# Patient Record
Sex: Female | Born: 1960 | Race: White | Hispanic: No | Marital: Single | Smoking: Former smoker
Health system: Southern US, Community
[De-identification: ages and names within clinical notes are randomized; demographics above are authoritative.]

## PROBLEM LIST (undated history)

## (undated) DIAGNOSIS — I10 Essential (primary) hypertension: Secondary | ICD-10-CM

## (undated) DIAGNOSIS — I2510 Atherosclerotic heart disease of native coronary artery without angina pectoris: Secondary | ICD-10-CM

## (undated) DIAGNOSIS — N8502 Endometrial intraepithelial neoplasia [EIN]: Secondary | ICD-10-CM

## (undated) DIAGNOSIS — Z87891 Personal history of nicotine dependence: Secondary | ICD-10-CM

## (undated) DIAGNOSIS — I5030 Unspecified diastolic (congestive) heart failure: Secondary | ICD-10-CM

## (undated) DIAGNOSIS — I219 Acute myocardial infarction, unspecified: Secondary | ICD-10-CM

## (undated) DIAGNOSIS — E119 Type 2 diabetes mellitus without complications: Secondary | ICD-10-CM

## (undated) DIAGNOSIS — E669 Obesity, unspecified: Secondary | ICD-10-CM

## (undated) DIAGNOSIS — E785 Hyperlipidemia, unspecified: Secondary | ICD-10-CM

## (undated) DIAGNOSIS — J449 Chronic obstructive pulmonary disease, unspecified: Secondary | ICD-10-CM

## (undated) HISTORY — PX: LAPAROSCOPIC HYSTERECTOMY: SHX1926

## (undated) HISTORY — PX: VENTRAL HERNIA REPAIR: SHX424

## (undated) HISTORY — PX: CHOLECYSTECTOMY: SHX55

---

## 2005-03-03 ENCOUNTER — Ambulatory Visit: Payer: Self-pay | Admitting: Family Medicine

## 2005-03-05 ENCOUNTER — Ambulatory Visit: Payer: Self-pay | Admitting: Family Medicine

## 2008-09-09 DIAGNOSIS — J449 Chronic obstructive pulmonary disease, unspecified: Secondary | ICD-10-CM | POA: Insufficient documentation

## 2011-09-08 ENCOUNTER — Inpatient Hospital Stay: Payer: Self-pay | Admitting: Psychiatry

## 2011-09-08 LAB — DRUG SCREEN, URINE
Barbiturates, Ur Screen: NEGATIVE (ref ?–200)
Benzodiazepine, Ur Scrn: NEGATIVE (ref ?–200)
Cannabinoid 50 Ng, Ur ~~LOC~~: NEGATIVE (ref ?–50)
Methadone, Ur Screen: NEGATIVE (ref ?–300)
Opiate, Ur Screen: NEGATIVE (ref ?–300)
Tricyclic, Ur Screen: POSITIVE (ref ?–1000)

## 2011-09-08 LAB — CBC
HGB: 13.9 g/dL (ref 12.0–16.0)
MCH: 29.1 pg (ref 26.0–34.0)
MCV: 89 fL (ref 80–100)
Platelet: 357 10*3/uL (ref 150–440)
RBC: 4.77 10*6/uL (ref 3.80–5.20)
RDW: 19.7 % — ABNORMAL HIGH (ref 11.5–14.5)
WBC: 12.7 10*3/uL — ABNORMAL HIGH (ref 3.6–11.0)

## 2011-09-08 LAB — T4, FREE: Free Thyroxine: 3.4 ng/dL — ABNORMAL HIGH (ref 0.76–1.46)

## 2011-09-08 LAB — COMPREHENSIVE METABOLIC PANEL
Alkaline Phosphatase: 105 U/L (ref 50–136)
BUN: 8 mg/dL (ref 7–18)
Calcium, Total: 9.7 mg/dL (ref 8.5–10.1)
Co2: 23 mmol/L (ref 21–32)
EGFR (Non-African Amer.): >60
Glucose: 99 mg/dL (ref 65–99)
Osmolality: 274 (ref 275–301)
Potassium: 4 mmol/L (ref 3.5–5.1)
Sodium: 138 mmol/L (ref 136–145)
Total Protein: 8.3 g/dL — ABNORMAL HIGH (ref 6.4–8.2)

## 2011-09-08 LAB — ETHANOL
Ethanol %: <0.003 % (ref 0.000–0.080)
Ethanol: <3 mg/dL

## 2011-09-08 LAB — SALICYLATE LEVEL: Salicylates, Serum: 2.7 mg/dL

## 2011-09-12 LAB — CBC WITH DIFFERENTIAL/PLATELET
Basophil #: 0.1 10*3/uL (ref 0.0–0.1)
Basophil %: 0.5 %
Eosinophil %: 1.5 %
HCT: 41.6 % (ref 35.0–47.0)
HGB: 13.2 g/dL (ref 12.0–16.0)
Lymphocyte #: 3.4 10*3/uL (ref 1.0–3.6)
Lymphocyte %: 30.7 %
MCHC: 31.8 g/dL — ABNORMAL LOW (ref 32.0–36.0)
MCV: 89 fL (ref 80–100)
Monocyte #: 1 10*3/uL — ABNORMAL HIGH (ref 0.0–0.7)
Platelet: 305 10*3/uL (ref 150–440)
RBC: 4.67 10*6/uL (ref 3.80–5.20)

## 2012-02-08 LAB — COMPREHENSIVE METABOLIC PANEL
BUN: 15 mg/dL (ref 7–18)
Bilirubin,Total: 0.3 mg/dL (ref 0.2–1.0)
Chloride: 104 mmol/L (ref 98–107)
Co2: 26 mmol/L (ref 21–32)
Creatinine: 1.3 mg/dL (ref 0.60–1.30)
EGFR (African American): 55 — ABNORMAL LOW
EGFR (Non-African Amer.): 47 — ABNORMAL LOW
Osmolality: 276 (ref 275–301)
Potassium: 3.8 mmol/L (ref 3.5–5.1)
SGPT (ALT): 23 U/L (ref 12–78)
Sodium: 137 mmol/L (ref 136–145)
Total Protein: 8.5 g/dL — ABNORMAL HIGH (ref 6.4–8.2)

## 2012-02-08 LAB — ETHANOL: Ethanol: <3 mg/dL

## 2012-02-08 LAB — DRUG SCREEN, URINE
Amphetamines, Ur Screen: NEGATIVE (ref ?–1000)
Barbiturates, Ur Screen: NEGATIVE (ref ?–200)
Benzodiazepine, Ur Scrn: NEGATIVE (ref ?–200)
Cannabinoid 50 Ng, Ur ~~LOC~~: NEGATIVE (ref ?–50)
Methadone, Ur Screen: NEGATIVE (ref ?–300)
Opiate, Ur Screen: NEGATIVE (ref ?–300)
Phencyclidine (PCP) Ur S: NEGATIVE (ref ?–25)

## 2012-02-08 LAB — CBC
HCT: 48.7 % — ABNORMAL HIGH (ref 35.0–47.0)
HGB: 15.5 g/dL (ref 12.0–16.0)
MCHC: 31.8 g/dL — ABNORMAL LOW (ref 32.0–36.0)
Platelet: 365 10*3/uL (ref 150–440)
RBC: 5.62 10*6/uL — ABNORMAL HIGH (ref 3.80–5.20)
RDW: 18.1 % — ABNORMAL HIGH (ref 11.5–14.5)
WBC: 21 10*3/uL — ABNORMAL HIGH (ref 3.6–11.0)

## 2012-02-09 ENCOUNTER — Inpatient Hospital Stay: Payer: Self-pay | Admitting: Psychiatry

## 2012-02-09 LAB — URINALYSIS, COMPLETE
Bacteria: NONE SEEN
Glucose,UR: NEGATIVE mg/dL (ref 0–75)
Ketone: NEGATIVE
Nitrite: NEGATIVE
Specific Gravity: 1.025 (ref 1.003–1.030)
Squamous Epithelial: 16
WBC UR: 3 /HPF (ref 0–5)

## 2012-02-09 LAB — HEMOGLOBIN A1C: Hemoglobin A1C: 6 % (ref 4.2–6.3)

## 2012-05-03 ENCOUNTER — Emergency Department: Payer: Self-pay | Admitting: Emergency Medicine

## 2012-07-12 ENCOUNTER — Emergency Department: Payer: Self-pay | Admitting: Emergency Medicine

## 2012-07-12 LAB — COMPREHENSIVE METABOLIC PANEL
Albumin: 3.1 g/dL — ABNORMAL LOW (ref 3.4–5.0)
BUN: 11 mg/dL (ref 7–18)
Calcium, Total: 9.6 mg/dL (ref 8.5–10.1)
Chloride: 110 mmol/L — ABNORMAL HIGH (ref 98–107)
Co2: 20 mmol/L — ABNORMAL LOW (ref 21–32)
Creatinine: 0.71 mg/dL (ref 0.60–1.30)
Osmolality: 274 (ref 275–301)
Potassium: 4.2 mmol/L (ref 3.5–5.1)
SGOT(AST): 19 U/L (ref 15–37)
Sodium: 138 mmol/L (ref 136–145)
Total Protein: 7.9 g/dL (ref 6.4–8.2)

## 2012-07-12 LAB — URINALYSIS, COMPLETE
Bilirubin,UR: NEGATIVE
Hyaline Cast: 10
Nitrite: NEGATIVE
Ph: 5 (ref 4.5–8.0)
Protein: >=500
RBC,UR: 13 /HPF (ref 0–5)
Squamous Epithelial: 12

## 2012-07-12 LAB — CBC
HCT: 48.6 % — ABNORMAL HIGH (ref 35.0–47.0)
HGB: 16 g/dL (ref 12.0–16.0)
MCH: 29.9 pg (ref 26.0–34.0)
MCHC: 32.8 g/dL (ref 32.0–36.0)
MCV: 91 fL (ref 80–100)
Platelet: 330 10*3/uL (ref 150–440)
RBC: 5.33 10*6/uL — ABNORMAL HIGH (ref 3.80–5.20)
RDW: 16.9 % — ABNORMAL HIGH (ref 11.5–14.5)

## 2012-07-12 LAB — TROPONIN I: Troponin-I: <0.02 ng/mL

## 2012-07-12 LAB — PRO B NATRIURETIC PEPTIDE: B-Type Natriuretic Peptide: 164 pg/mL — ABNORMAL HIGH (ref 0–125)

## 2012-07-12 LAB — CK TOTAL AND CKMB (NOT AT ARMC): CK, Total: 36 U/L (ref 21–215)

## 2012-07-18 LAB — CULTURE, BLOOD (SINGLE)

## 2012-10-20 ENCOUNTER — Inpatient Hospital Stay: Payer: Self-pay | Admitting: Internal Medicine

## 2012-10-20 LAB — PRO B NATRIURETIC PEPTIDE: B-Type Natriuretic Peptide: 81 pg/mL (ref 0–125)

## 2012-10-20 LAB — BASIC METABOLIC PANEL WITH GFR
Anion Gap: 5 — ABNORMAL LOW
BUN: 14 mg/dL
Calcium, Total: 10.8 mg/dL — ABNORMAL HIGH
Chloride: 105 mmol/L
Co2: 30 mmol/L
Creatinine: 0.77 mg/dL
EGFR (African American): >60
EGFR (Non-African Amer.): >60
Glucose: 100 mg/dL — ABNORMAL HIGH
Osmolality: 280
Potassium: 4.1 mmol/L
Sodium: 140 mmol/L

## 2012-10-20 LAB — HEPATIC FUNCTION PANEL A (ARMC)
Albumin: 3 g/dL — ABNORMAL LOW (ref 3.4–5.0)
Bilirubin,Total: 0.2 mg/dL (ref 0.2–1.0)
SGOT(AST): 23 U/L (ref 15–37)
SGPT (ALT): 20 U/L (ref 12–78)
Total Protein: 7.7 g/dL (ref 6.4–8.2)

## 2012-10-20 LAB — CK TOTAL AND CKMB (NOT AT ARMC)
CK, Total: 68 U/L
CK-MB: <0.5 ng/mL — ABNORMAL LOW

## 2012-10-20 LAB — CBC
HGB: 14.7 g/dL (ref 12.0–16.0)
MCH: 29.7 pg (ref 26.0–34.0)
Platelet: 331 10*3/uL (ref 150–440)
WBC: 15 10*3/uL — ABNORMAL HIGH (ref 3.6–11.0)

## 2012-10-20 LAB — TROPONIN I
Troponin-I: <0.02 ng/mL
Troponin-I: <0.02 ng/mL

## 2012-10-21 LAB — CBC WITH DIFFERENTIAL/PLATELET
Basophil #: 0.1 10*3/uL (ref 0.0–0.1)
Basophil %: 0.5 %
Eosinophil %: 0 %
MCH: 30.1 pg (ref 26.0–34.0)
MCHC: 33.6 g/dL (ref 32.0–36.0)
MCV: 90 fL (ref 80–100)
Monocyte #: 0.2 x10 3/mm (ref 0.2–0.9)
Neutrophil %: 87.3 %
Platelet: 334 10*3/uL (ref 150–440)
RDW: 16.1 % — ABNORMAL HIGH (ref 11.5–14.5)
WBC: 14.9 10*3/uL — ABNORMAL HIGH (ref 3.6–11.0)

## 2012-10-21 LAB — BASIC METABOLIC PANEL
BUN: 15 mg/dL (ref 7–18)
Calcium, Total: 10.4 mg/dL — ABNORMAL HIGH (ref 8.5–10.1)
Chloride: 108 mmol/L — ABNORMAL HIGH (ref 98–107)
Co2: 27 mmol/L (ref 21–32)
Creatinine: 0.78 mg/dL (ref 0.60–1.30)
Glucose: 203 mg/dL — ABNORMAL HIGH (ref 65–99)
Osmolality: 286 (ref 275–301)
Potassium: 4.8 mmol/L (ref 3.5–5.1)
Sodium: 140 mmol/L (ref 136–145)

## 2012-10-21 LAB — LIPID PANEL
Cholesterol: 210 mg/dL — ABNORMAL HIGH (ref 0–200)
Ldl Cholesterol, Calc: 123 mg/dL — ABNORMAL HIGH (ref 0–100)
Triglycerides: 302 mg/dL — ABNORMAL HIGH (ref 0–200)
VLDL Cholesterol, Calc: 60 mg/dL — ABNORMAL HIGH (ref 5–40)

## 2012-10-21 LAB — TROPONIN I: Troponin-I: <0.02 ng/mL

## 2012-10-21 LAB — MAGNESIUM: Magnesium: 2 mg/dL

## 2012-10-26 LAB — CULTURE, BLOOD (SINGLE)

## 2012-11-29 ENCOUNTER — Ambulatory Visit: Payer: Self-pay

## 2013-10-23 IMAGING — CT CT HEAD WITHOUT CONTRAST
1 series · 16 of 30 positions shown, 20 images · non-contrast
Comparison: none

REASON FOR EXAM: headache, diffuse paresthesias, no motor deficits
COMMENTS:

[Series 2: soft tissue · axial · 0.42mm/px · z∈[-183,-43]mm · 16 of 32 slices shown, 20 images]
[im 2/32  brain]
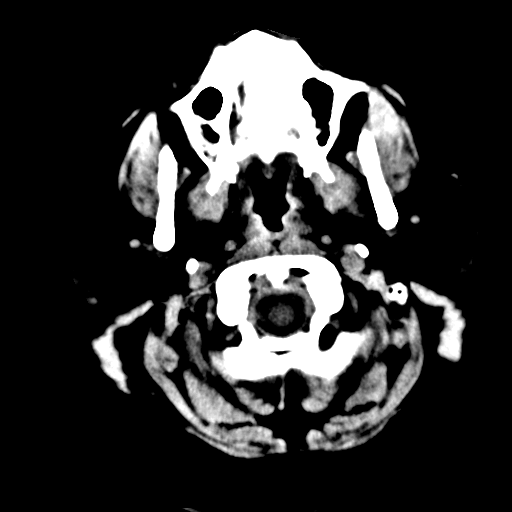
[im 2/32  bone]
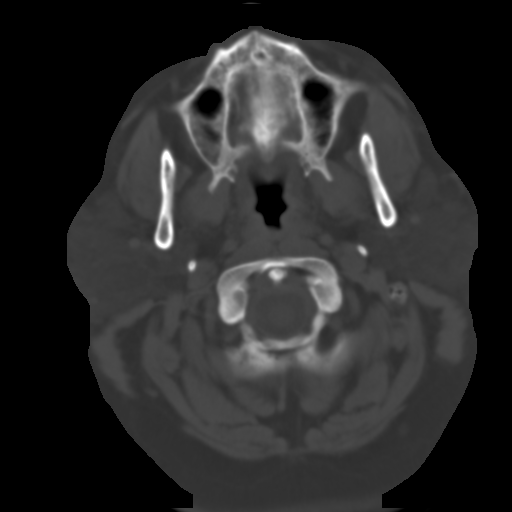
[im 4/32  brain]
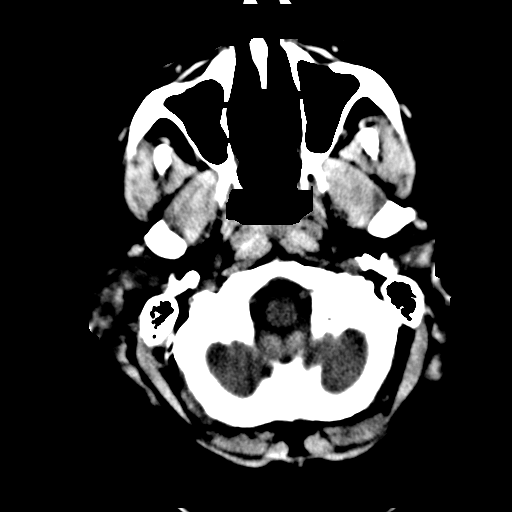
[im 6/32  brain]
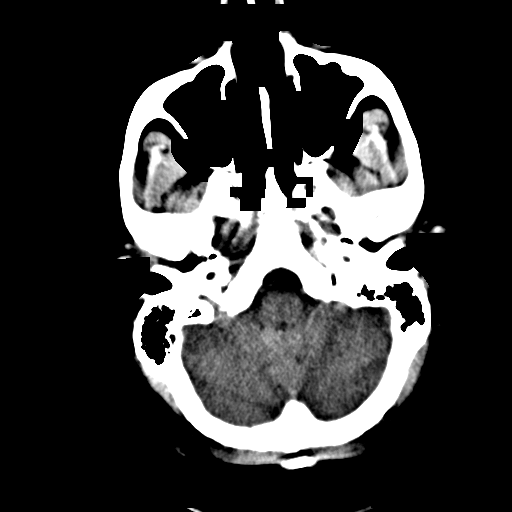
[im 8/32  brain]
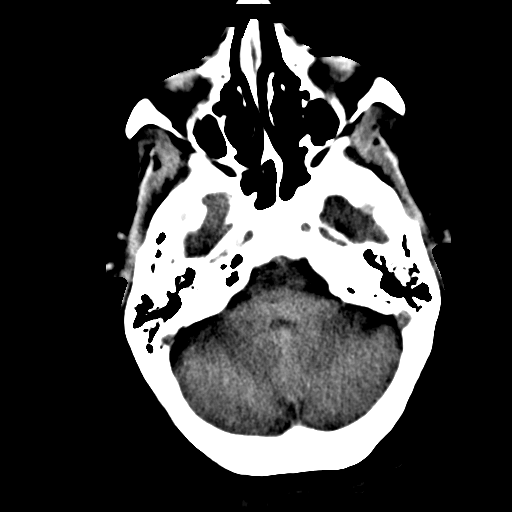
[im 9/32  brain]
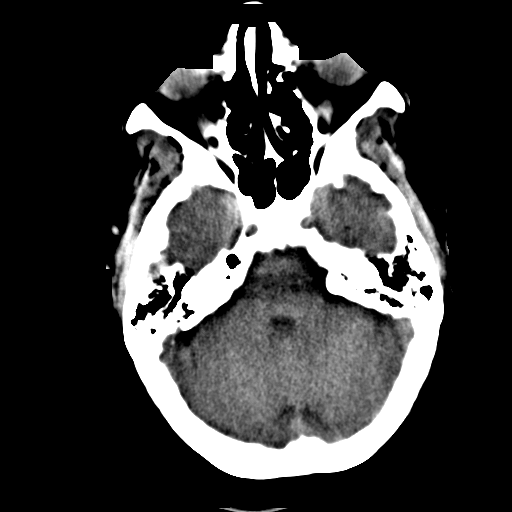
[im 9/32  bone]
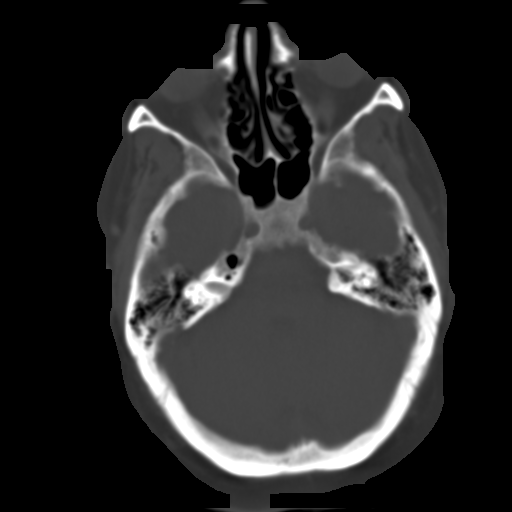
[im 11/32  brain]
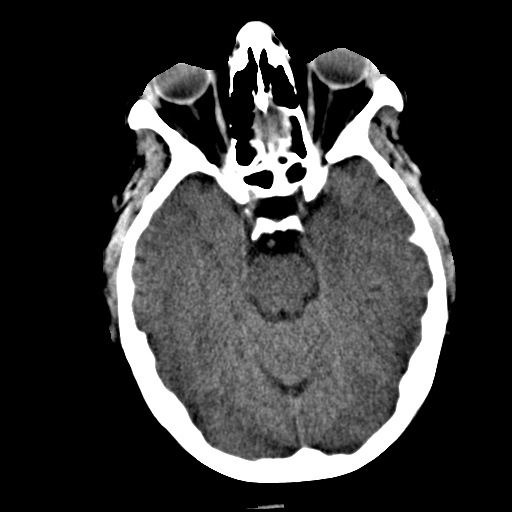
[im 13/32  brain]
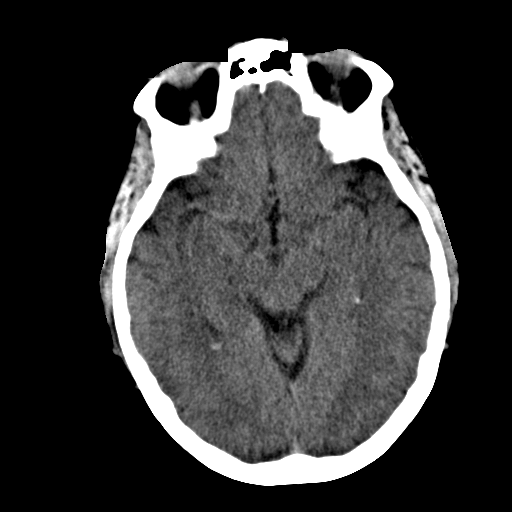
[im 15/32  brain]
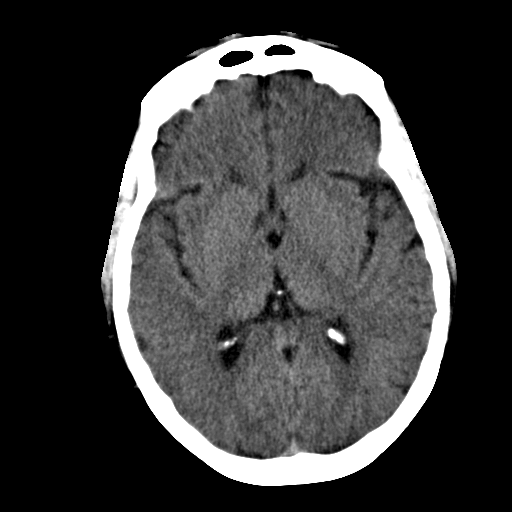
[im 17/32  brain]
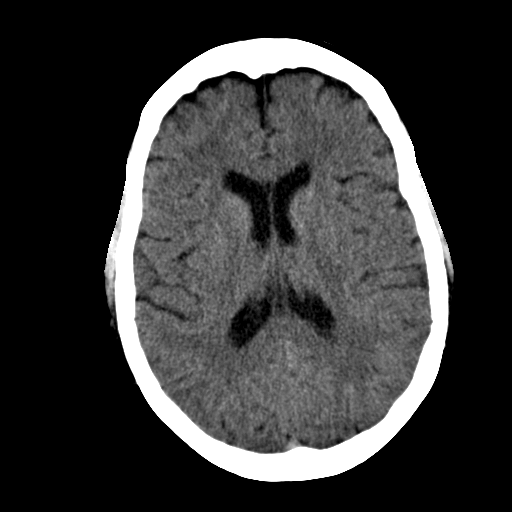
[im 17/32  bone]
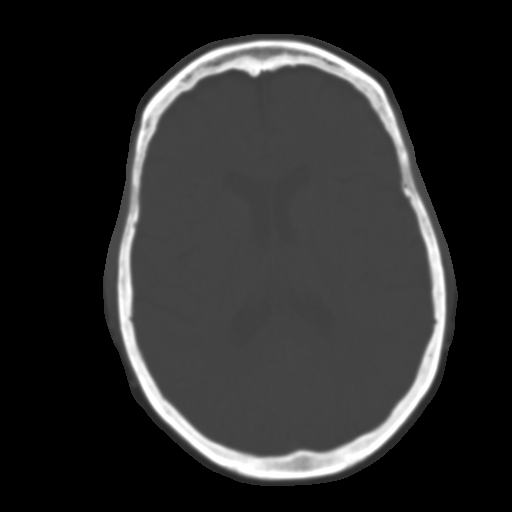
[im 19/32  brain]
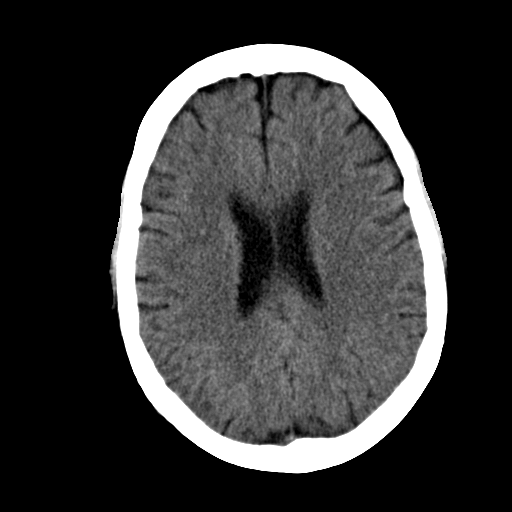
[im 21/32  brain]
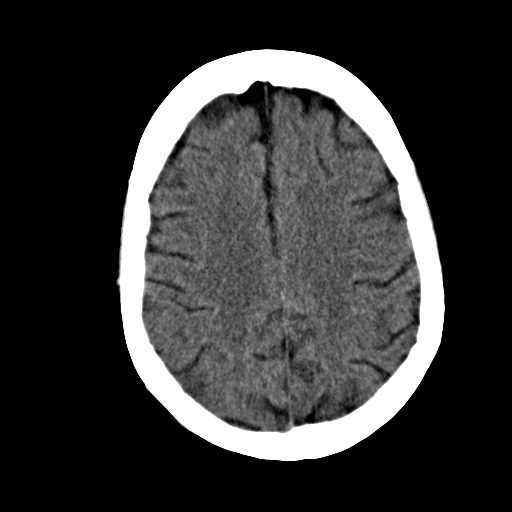
[im 23/32  brain]
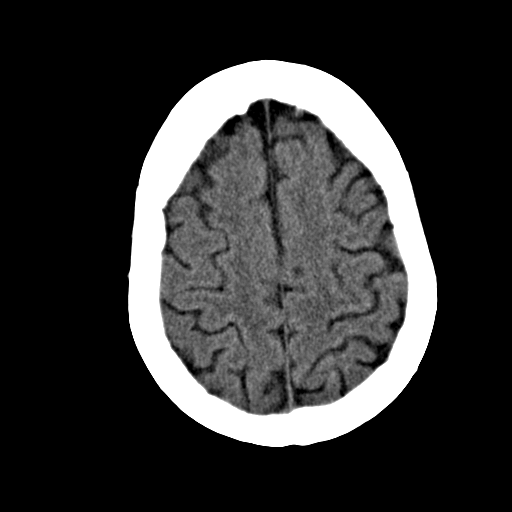
[im 24/32  brain]
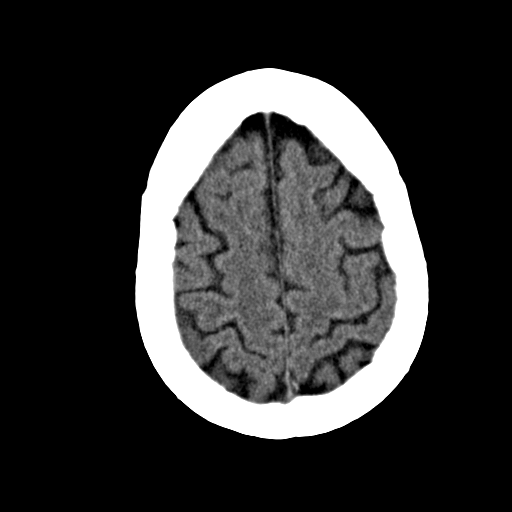
[im 24/32  bone]
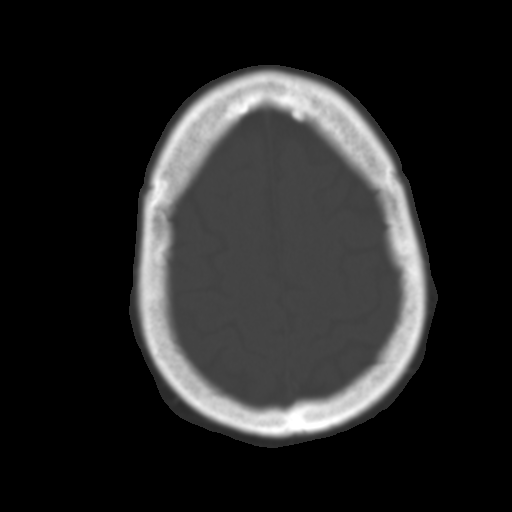
[im 26/32  brain]
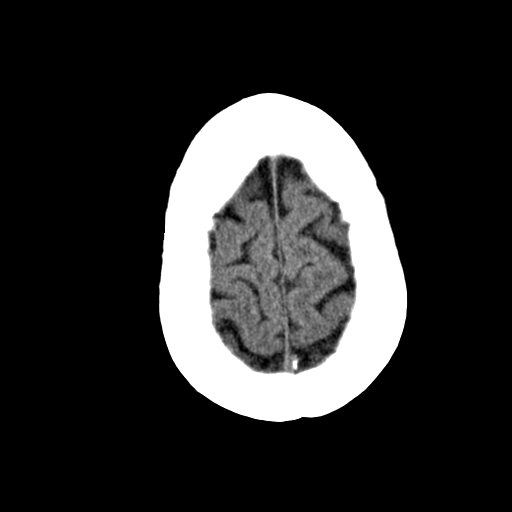
[im 28/32  brain]
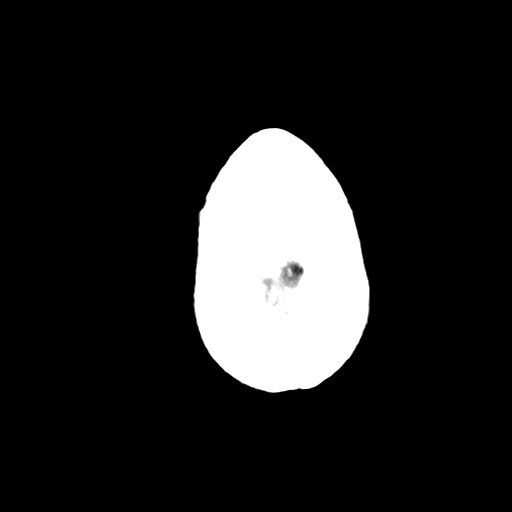
[im 30/32  brain]
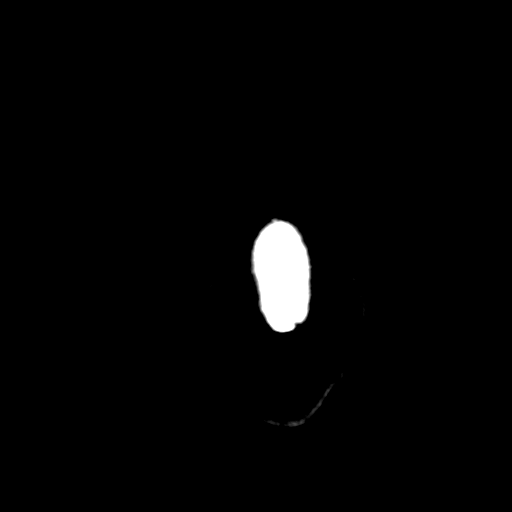

[16 of 30 positions shown; findings below may reference images not displayed]

PROCEDURE:     CT  - CT HEAD WITHOUT CONTRAST  - July 12, 2012  [DATE]

RESULT:     Axial noncontrast CT scanning was performed through the brain
with reconstructions at 3 mm intervals and slice thicknesses.

The ventricles are normal in size and position. There is no intracranial
hemorrhage nor intracranial mass effect. The cerebellum and brainstem are
normal in density. There is no evidence of an evolving ischemic event. There
are no abnormal intracranial calcifications. At bone window settings the
observed portions of the paranasal sinuses and mastoid air cells are clear.
There is no evidence of an acute skull fracture.
IMPRESSION: Normal CT scan of the brain for age.

[REDACTED]

## 2014-02-01 IMAGING — CR DG CHEST 2V
1 series · 2 of 2 positions shown · non-contrast
Comparison: none

REASON FOR EXAM: SOB.
COMMENTS:

[Series 1: w chest pa · 0.14mm/px · 2 of 2 slices shown]
[im 1/2]
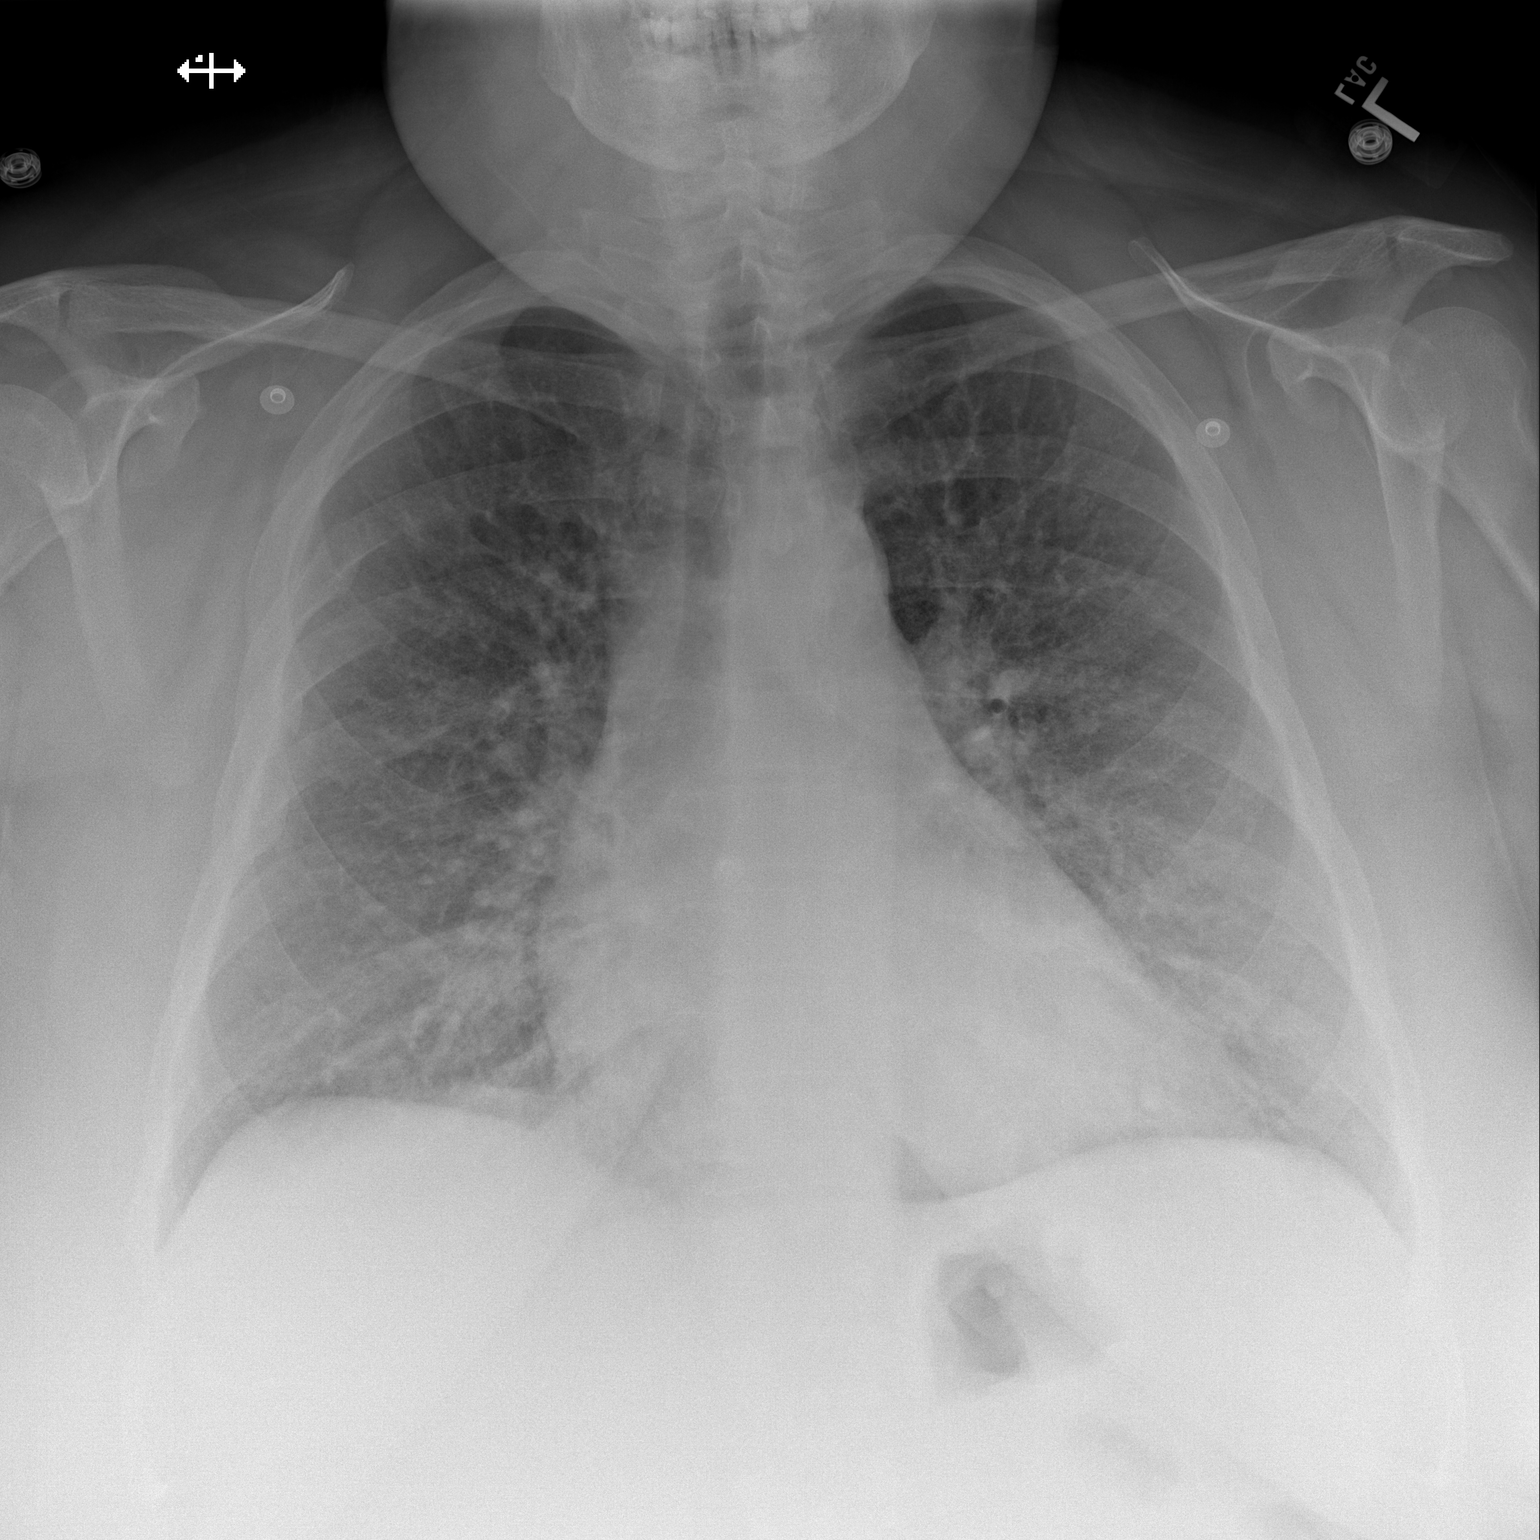
[im 2/2]
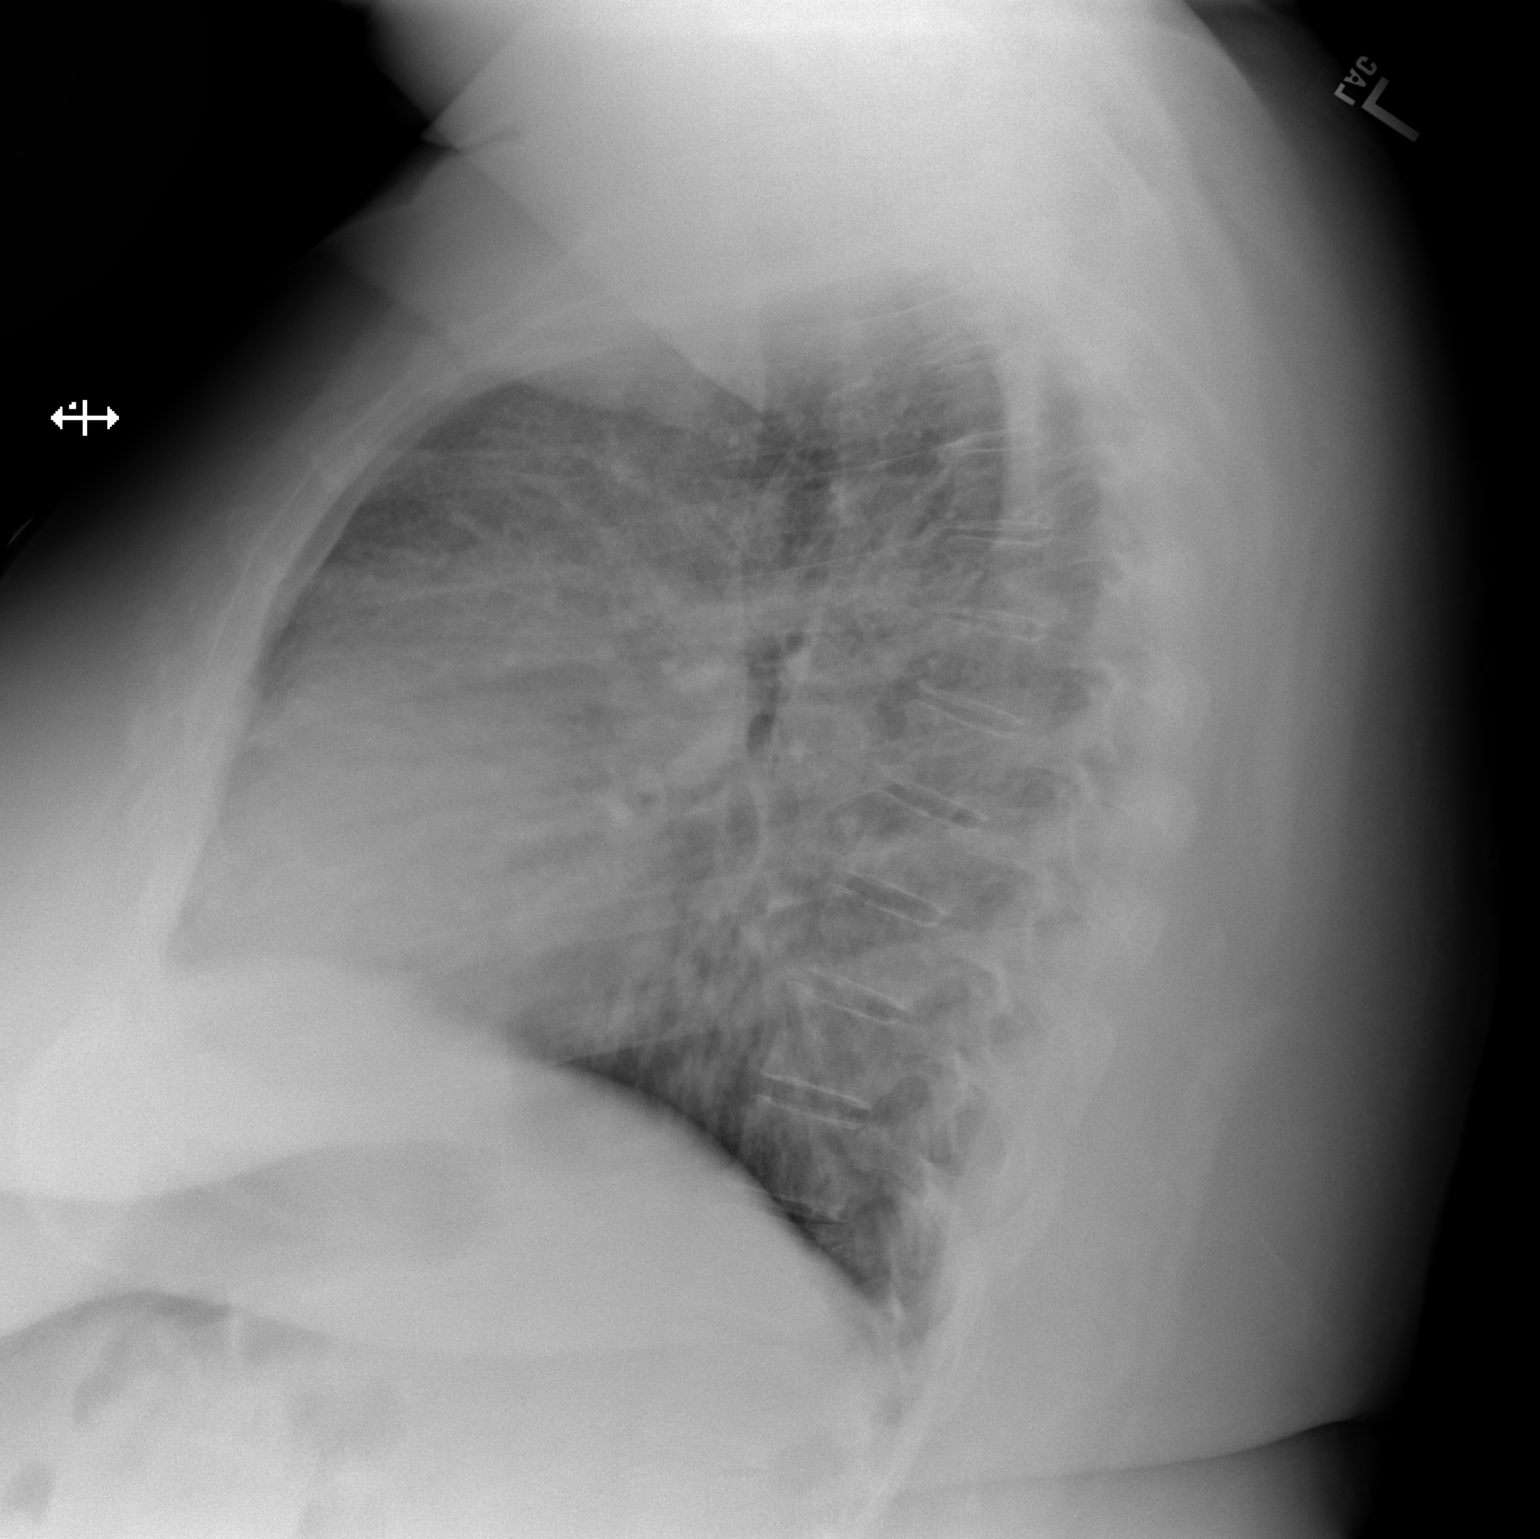

[2 of 2 positions shown; findings below may reference images not displayed]

PROCEDURE:     DXR - DXR CHEST PA (OR AP) AND LATERAL  - October 21, 2012  [DATE]

RESULT:     Comparison is made to the previous examination dated 20 October, 2012.
There is diffuse interstitial thickening. Peribronchial cuffing is present.
There is no effusion or pneumothorax. The heart is borderline enlarged.
There is no significant change.
IMPRESSION: Changes suggestive of diffuse interstitial edematous or
nonedematous infiltrate. Correlate for pulmonary edema.

[REDACTED]

## 2014-10-05 NOTE — Discharge Summary (Signed)
PATIENT NAME:  [name], [name] MR#:  [id number] DATE OF BIRTH:  28-Nov-1960  DATE OF ADMISSION:  10/20/2012 DATE OF DISCHARGE:  10/24/2012  DISCHARGE DIAGNOSES:  1. Acute on chronic respiratory failure, likely due to chronic obstructive pulmonary disease exacerbation, improving on steroids and nebulizer breathing treatment.  2. Chronic obstructive pulmonary disease exacerbation, improving.  3. Depression, not suicidal, not dangerous, improving on BuSpar 3 times [name] day and extra dose of  Neurontin at night. Also started on Elavil at nighttime for pain control and sleep.   SECONDARY DIAGNOSES:  4. Chronic obstructive pulmonary disease.  5. Morbid obesity. 6. Diabetes.  7. History of myocardial infarction.  8. Multiple suicidal attempts in the past.   CONSULTATIONS: Psychiatry, Dr. Toni Amend.   PROCEDURES AND RADIOLOGY:  1. Chest x-ray on 8th of May showed cardiomegaly with pulmonary edema.  2. Chest x-ray on 9th of May showed changes suggestive of diffuse interstitial edematous/nonedematous infiltrate, correlate for pulmonary edema.  3. [name] 2-D echocardiogram on 9th of May showed normal LV systolic function with EF of 55% to 60%, normal global LV systolic function. Mild mitral and tricuspid regurgitation.   MAJOR LABORATORY PANEL: Blood cultures x2 were negative on 8th of May.   HISTORY AND SHORT HOSPITAL COURSE: The patient is [name] 54 year old morbidly obese female with the above-mentioned medical problems, who was admitted for acute on chronic respiratory failure, likely secondary to COPD exacerbation. There was some concern for CHF, for which 2-D echo was obtained, but this was ruled out. This was thought to be just plain COPD exacerbation. She responded fairly well to steroids and nebulizer breathing treatment and was close to her baseline on 12th of May and was discharged home in stable condition. She was evaluated by psychiatry, Dr. Toni Amend, while in the hospital considering her significant  depression and was started on several medications which seemed to help her, and she was much better.   PERTINENT PHYSICAL EXAMINATION ON THE DATE OF DISCHARGE:  VITAL SIGNS: As follows: Temperature 97.7, heart rate 98 per minute, respirations 19 per minute, blood pressure 157/99 mmHg. She was saturating 96% on room air.  CARDIOVASCULAR: S1, S2 normal. No murmurs, rubs or gallops.  LUNGS: Clear to auscultation bilaterally. No wheezing, rales, rhonchi or crepitation.  ABDOMEN: Soft, benign.  NEUROLOGIC: Nonfocal examination.  All other physical examination remained at baseline.   DISCHARGE MEDICATIONS:  1. Spiriva once daily.  2. Hydrochlorothiazide/lisinopril 12.5/10 one tablet p.o. daily.  3. Metformin 500 mg p.o. daily.  4. Requip 1 mg p.o. 3 times [name] day.  5. Potassium chloride 8 mEq p.o. daily.  6. Lasix 20 mg p.o. daily.  7. Albuterol nebulizer every 4 hours as needed. 8. Combivent 1 puff inhaled every 6 hours.  9. Prednisone 60 mg p.o. daily, taper by 10 mg daily until finished.  10. Gabapentin 300 mg p.o. 3 times [name] day. 11. Gabapentin 300 mg 2 capsules p.o. q.h.[name].  12. Amitriptyline 10 mg p.o. q.h.[name].  13. Trazodone 50 mg p.o. q.h.[name].  14. Buspirone 10 mg p.o. 3 times [name] day. 15. Habitrol 21 mg transdermal patch once daily.   DISCHARGE DIET: Low sodium, low fat, low cholesterol.   DISCHARGE ACTIVITY: As tolerated.   DISCHARGE INSTRUCTIONS AND FOLLOWUP: The patient was instructed to follow with her primary care physician, Dr. Elizabeth Sauer, in 1 to 2 weeks. She will need followup with Dr. Meredeth Ide from pulmonary in 2 to 4 weeks.   TOTAL TIME DISCHARGING THIS PATIENT: 55 minutes.   ____________________________  Kinzy Weyers [name]. Sherryll BurgerShah, MD vss:OSi D: 10/28/2012 18:38:29 ET T: 10/29/2012 06:59:17 ET JOB#: [id number]  cc: [name] [name]. Sherryll BurgerShah, MD, <Dictator> Wendy Limerickeanna C. Jones, MD Wendy AmelJohn T. Clapacs, MD Wendy E. Meredeth IdeFleming, MD [name] [facility] MD ELECTRONICALLY SIGNED 10/31/2012 8:55

## 2014-10-05 NOTE — Consult Note (Signed)
Psychiatry: Followup for this patient with a history of anxiety and depression. Today she reports that she slept better last night. Anxiety is a little bit decreased. She is not feeling as agitated. Denies any suicidal ideation. Affect appears calmer. Tolerating medicines well.and educational therapy done. No change to medication plan.  Electronic Signatures: Clapacs, Jackquline DenmarkJohn T (MD)  (Signed on 10-May-14 23:08)  Authored  Last Updated: 10-May-14 23:08 by Audery Amellapacs, John T (MD)

## 2014-10-05 NOTE — Consult Note (Signed)
PATIENT NAME:  Wendy [name], Wendy [name] MR#:  [id number] DATE OF BIRTH:  06-Mar-1961  DATE OF CONSULTATION:  10/21/2012  REFERRING PHYSICIAN:   CONSULTING PHYSICIAN:  Wendy AmelJohn T. Austine Wiedeman, MD  IDENTIFYING INFORMATION AND REASON FOR CONSULTATION:  [name] 54 year old woman admitted to the hospital for chronic obstructive pulmonary disease exacerbation along with multiple other medical problems.  Consultation because of history of depression and anxiety.   HISTORY OF PRESENT ILLNESS:  Information obtained from the patient and from the chart.  The patient was admitted to the hospital on referral from an outpatient doctor because of worsening shortness of breath which appears to probably be related mostly to her COPD.  The patient reports to me that she has been feeling irritable at times.  She says that she gets particularly angry when she is denied the food that she wants to eat.  She finds the hospital environment to be very upsetting because she is constantly being told that she cannot eat sugar, cannot eat salt, cannot have [name] cigarette et Karie Sodacetera.  Even though she knows intellectually the reasons for those things it makes her very angry to be told that she cannot do things.  Apparently, she was somewhat ugly about it this morning for which she currently apologizes.  The patient says that in general her mood has been anxious.  She has chronic poor sleep at night sleeping only [name] couple hours at [name] time.  Recently she has been having more mood swings back and forth with irritability, anger, depression, tearfulness.  This has been since stopping her Effexor in the last couple of months.  She discontinued her Effexor however because she thought it was causing her to have restless legs feeling at night.  Since stopping the Effexor the restless legs has gotten better, but she is having more psychiatric symptoms.  She is still taking her BuSpar which she thinks is at 5 mg 3 times [name] day and has been taking trazodone.  She stopped taking  Ambien because it really was not helping with her sleep regularly.  She has been following up with [name] therapist with [facility], but has not seen Dr. Marguerite [name] in [name] while.   PAST PSYCHIATRIC HISTORY:  The patient had [name] hospitalization on the psychiatric ward in mid to late 2013 because of mood and anxiety problems.  She responded to treatment on the psychiatry ward including medication and was [facility] 02[facility]16/2012 [facility]  She has been going there ever since.  This is her only psychiatric hospitalization.  She denies ever having actually tried to kill herself.  Denies any history of physical violence.  Does not report history of psychotic symptoms.   SOCIAL HISTORY:  The patient has severe social challenges.  She is homeless and stays just where she can find someone who will take her in.  She had been staying with her son previously, but her son has thrown her out of the house because his current boyfriend does not want her around.  Consequently she had to stay with some friends in [place], but that is only [name] temporary arrangement as well.  She has worked with [employer] on putting in an application 2 [employer], but that is still pending.  She does have several members of her family who seem to still have strong emotional feelings for her and she has strong feelings for her children, her parents and her grandchildren.  She has multiple reasons she can cite for wanting to live.  PAST MEDICAL HISTORY:  Morbid obesity accompanied by multiple medical problems including diabetes, elevated cholesterol, COPD from her heavy smoking.  She continues to smoke heavily despite her breathing problems.  She is aware of that she has multiple medical problems, but has been unable to control herself to stay compliant and get them under good control.  She has recently started to have worsening burning pain in her feet which she says has been diagnosed as fibromyalgia, but it sounds like diabetic neuropathy  from how she describes it.   REVIEW OF SYSTEMS:  Complains currently of anxious mood.  Feeling confined and claustrophobic in the hospital.  Being frustrated.  Poor sleep.  Increased appetite.  Denies suicidal or homicidal ideation.  Denies psychotic symptoms.  She is short of breath, having trouble breathing.  Has pain in her legs and numbness in her hands.  MENTAL STATUS EXAMINATION:  The patient is mildly disheveled despite obvious attempts to clean herself up and looks older than her stated age.  She is cooperative and pleasant and chatty in the interview.  Makes good eye contact.  Psychomotor activity normal given her physical condition.  Affect mildly anxious, reactive, but appropriate, smiles quite [name] bit.  Makes appropriate jokes.  Mood stated as being better.  Thoughts are lucid.  No evidence of loosening of associations or delusions.  Denies auditory or visual hallucinations.  Denies suicidal or homicidal ideation.  Shows good judgment and insight overall, although she admits that she gets impulsive at times and sometimes feels like she cannot control herself.  Baseline intelligence normal.   CURRENT MEDICATIONS:  Multiple medicines, but psychiatrically it sounds like what she has been taking outside the hospital was BuSpar 5 mg 3 times [name] day, Neurontin 300 mg 3 times [name] day, trazodone 50 to 100 mg at night probably and has been off of the Ambien.    ALLERGIES:  LEVOFLOXACIN.   ASSESSMENT:  [name] 54 year old woman with chronic depression and anxiety.  Has had multiple diagnoses, but I would probably say right now that dysthymia and generalized anxiety would fit the picture.  May have [name] personality disorder, although right now she seems to be pretty pleasant and accommodating and have reasonably good insight.  The patient's anxiety problems are certainly going to work against having the patience to cooperate with medical treatment and she is aware of that.  Psychiatrically she probably could do with  some adjustment to her medicine.  She says that all the serotonin reuptake inhibitor she has taken which include Zoloft and Paxil as well as the Effexor have caused restless leg symptoms.   TREATMENT PLAN:  Given her history of side effects we discussed possible options.  I would like to increase her BuSpar to 10 mg 3 times [name] day.  I would also like to try to adding [name] small 10 mg of amitriptyline at night which may help with her sleep and pain.  If she tolerates it okay it could be very gradually titrated up to the point where it may help with her mood as well.  I would also add an extra 600 mg of Neurontin at night to make [name] total of 900 mg given at night.  This may help with her sleep as well as with the pain that is waking her up at night, [name] little bit better.  I will follow up.  She has follow-up treatment through [facility] already arranged.  If she is here through the weekend I recommend that the [facility]  community support liaison be notified.  They can probably be of some help in continuing her social work as she transitions outside the hospital.   DIAGNOSIS, PRINCIPAL AND PRIMARY:  AXIS I:  Dysthymia.   SECONDARY DIAGNOSES: AXIS I:  Generalized anxiety disorder.  AXIS II:  Deferred.  AXIS III:  Obesity, diabetes, chronic obstructive pulmonary disease, hypertension, elevated cholesterol, diabetic neuropathy.  AXIS IV:  Severe from homelessness.  AXIS V:  Functioning at time of evaluation 39.    ____________________________ Wendy Amel, MD jtc:ea D: 10/21/2012 19:45:38 ET T: 10/22/2012 03:59:29 ET JOB#: [id number]  cc: Wendy Amel, MD, <Dictator> Wendy Amel MD ELECTRONICALLY SIGNED 10/23/2012 21:50

## 2014-10-05 NOTE — H&P (Signed)
PATIENT NAME:  Wendy [name], Wendy [name] MR#:  [id number] DATE OF BIRTH:  11-12-1960  DATE OF ADMISSION:  10/20/2012  PRIMARY CARE PHYSICIAN: Dr. Robbie Lis at [facility].   PRESENTING COMPLAINT: Worsening shortness of breath.  REFERRING PHYSICIAN: Dr. Enedina Finner.   HISTORY OF PRESENT ILLNESS: This is [name] 54 year old female who has past medical history of hypercholesterolemia, hypocalcemia, morbid obesity, hypertension, chronic obstructive pulmonary disease, diabetes and multiple suicidal attempts in psychiatric history.   Complains that for the last few months she has been feeling increasingly short of breath especially while lying down flat in the bed at night and she has to sleep in [name] recliner. Her legs are also more swollen. This complaint is gradually getting worse, and so when she saw her primary care physician 2 to 3 weeks ago, she suggested her to go to [facility]. She went to [facility] ER, but because she did not get enough attention or medications, she just walked out from there without any medication or any interventions. Her condition continued to worsen and so today when she went to see her primary care, she suggested her to come to the ER over here.  Initial workup in ER showed normal BNP.  She was given steroids IV but not much response with nebulizer treatment and so she is being admitted for chronic obstructive pulmonary disease exacerbation.   REVIEW OF SYSTEMS:   CONSTITUTIONAL: Negative for fever. Positive for fatigue and generalized weakness.   EYES: No blurring, double vision, pain or redness.   EARS, NOSE, THROAT: No tinnitus, ear pain or hearing loss.   RESPIRATORY: No cough, wheezing, but has shortness of breath.   CARDIOVASCULAR: No chest pain or arrhythmia but had severe orthopnea and edema on the legs.   GASTROINTESTINAL: No nausea, vomiting, diarrhea, abdominal pain or hematemesis.   GENITOURINARY: No dysuria, hematuria or increased frequency of the urine.   SKIN: No  acne or rashes on the skin.   MUSCULOSKELETAL: No joint pain or swelling.   NEUROLOGICAL: No numbness, weakness, vertigo or headache.   PSYCHIATRIC: No anxiety, insomnia or bipolar disorder.   PAST MEDICAL HISTORY: Chronic obstructive pulmonary disease, continued smoking, morbid obesity, diabetes, non-ST elevation myocardial infarction in 2010, cardiac catheter was done no stent,  multiple suicidal attempts followed by psychiatrist, hypercholesterolemia.   PAST SURGICAL HISTORY:  Carpal tunnel release and repair of scalping incident at work.   FAMILY HISTORY: Mother has [name] history of skin cancer, colon cancer in both parents, Hodgkin's disease in maternal uncle, leukemia in maternal uncle.   SOCIAL HISTORY: Living with her parents and her daughters and then her friends frequently to and fro from one place to another.  Smokes 1-1/2 packs per day. Denies drinking alcohol or doing any illegal drug use.   MEDICATIONS AT HOME:  Albuterol inhaler every four hours as needed. Buspirone 7.5 mg oral tablet 3 times [name] day, Combivent 1 puff inhalation every 6 hours, furosemide 20 mg oral tablet once [name] day, Gabapentin 300 mg oral capsule 3 times [name] day, hydrochlorothiazide plus lisinopril 12.5 mg plus 10 mg oral tablet for high blood pressure and renal protection, metformin 500 mg oral tablet once [name] day, potassium chloride 8 mEq tablet extended-release once [name] day, Requip 1 mg oral tablet 3 times [name] day, Spiriva 18 mcg inhalation capsule once [name] day, trazodone 50 mg oral tablet 1 to 2 tablets once [name] day and bedtime and venlafaxine extended-release 150 mg oral capsule 2 times [name] day.   PHYSICAL EXAMINATION:  VITAL SIGNS: In ER, temperature 98.2, pulse rate 107, respiratory rate 22, blood pressure 140/75, pulse oximetry 95 on room air.   GENERAL: She is fully alert and oriented in mild distress, morbidly obese.   HEAD AND NECK:  Normocephalic.  Conjunctivae are pink.  Neck supple. No JVD.   RESPIRATORY:  Bilateral equal air entry. Few rhonchi heard.  No local tenderness on pressure.   CARDIOVASCULAR: S1, S2 present, regular. No murmur.   ABDOMEN: Obese bowel sounds present. No organomegaly.   SKIN: Changes of chronic fluid retention and edema on lower limbs.  Pitting edema on both lower limbs present.    JOINTS: No swelling or tenderness.   NEUROLOGICAL: Power 5/5.  Moves all four limbs.   LABORATORY RESULTS:  Glucose 100. BNP 81.  BUN 14, creatinine 0.77, sodium 140, potassium 4.1, chloride 105, calcium 10.8, total protein 7.7, bilirubin 0.2, alkaline phosphatase 102, SGOT 23, SGOT 20.  WBC 15,000, hemoglobin 14.7, platelet count 331, MCV 89.   EKG: Sinus tachy with [name] rate 107.   ASSESSMENT AND PLAN: [name] 54 year old female morbidly obese, hypertension, hypercholesterolemia and non-ST-elevation myocardial infarction in the past, history of depression, chronic obstructive pulmonary disease and continued smoker, has worsening shortness of breath with pedal edema and orthopnea for the last few months. BNP is negative. May be false due to obesity.  Few rhonchi on chest exam.   PLAN: 1. Chronic obstructive pulmonary disease exacerbation. Will give IV steroid, DuoNeb, Spiriva, and cefuroxime and repeat chest x-ray tomorrow morning to see for any new finding.  2. History of coronary artery disease in 2010.  We will continue home medication.  We will add aspirin and statin and check [name] lipid profile tomorrow morning.  3. Complaint of orthopnea and edema plus worsening shortness of breath.  Maybe all these complaints are due to her sleep apnea, but just to make sure we will also get echocardiogram about her cardiac status.  4. Hypertension. We will continue home medication.  5. Diabetes: Insulin sliding scale and we will hold metformin for now.  6. Depression: Continue home medication.  7. Current smoker.  Counseled for 5 minutes for smoking cessation. She agrees for that and wants to try nicotine patch  this time.    Total Time Spent: 50 minutes for this admission:    ____________________________ Hope PigeonVaibhavkumar G. Elisabeth PigeonVachhani, MD vgv:rw D: 10/20/2012 18:32:00 ET T: 10/20/2012 19:00:23 ET JOB#: [id number]  cc: Hope PigeonVaibhavkumar G. Elisabeth PigeonVachhani, MD, <Dictator> Altamese DillingVAIBHAVKUMAR Dymphna Wadley MD ELECTRONICALLY SIGNED 10/27/2012 6:22

## 2014-10-05 NOTE — Consult Note (Signed)
Brief Consult Note: Diagnosis: dysthymia, GAD,.   Patient was seen by consultant.   Consult note dictated.   Orders entered.   Comments: Psychiatry: PAtient seen. Patient with chronic mental health problems of depression, anxiety and irritability and also multiple medical issues. Patient denies SI and is not dangerous. She is chroniccally irritable and not great at impulse control. We discussed various med options. She does not want effexor as it causes her legs to feel restless at night. Will restart her buspar at 10mg  tid and add 600mg  extra neurontin at night for pain and sleep and also 10mg  elevil at night for pain and sleep. Patient agrees.  Electronic Signatures: Audery Amellapacs, John T (MD)  (Signed 09-May-14 19:34)  Authored: Brief Consult Note   Last Updated: 09-May-14 19:34 by Audery Amellapacs, John T (MD)

## 2014-10-07 NOTE — Discharge Summary (Signed)
PATIENT NAME:  [name], [name] MR#:  [id number] DATE OF BIRTH:  12-26-1960  DATE OF ADMISSION:  09/08/2011 DATE OF DISCHARGE:  09/16/2011  HISTORY OF PRESENT ILLNESS: [name] is [name] 54 year old female admitted to the inpatient behavioral health unit with depression and anxiety. She was also having thoughts of hopelessness and helplessness. She had stopped all of her psychotropic medications as well as her general medicines for two weeks in hopes that she would die. She was also described by her boyfriend looking for [name] gun to kill herself.   Please see the admission history and physical dictation.   ANCILLARY CLINICAL DATA: She continued CPAP as well as her general medications. Please see the admission history and physical.   HOSPITAL COURSE: [name] was admitted to the inpatient behavioral health unit and underwent milieu and group psychotherapy. She was started on venlafaxine, restarting what she had been on before, however, the venlafaxine was titrated to 150 mg XR q.[name].m. The augmentation with buspirone 5 mg t.i.d. was continued. She was continued on clonazepam 0.5 mg b.i.d. p.r.n. anxiety, Vistaril 25 to 50 mg at bedtime p.r.n. insomnia.   Over the course of the hospitalization her excessive crying stopped. She became progressively involved in the milieu and group psychotherapy.   Her energy improved. Her hope returned. Her suicidal ideation resolved. She developed constructive future goals. She also learned how to confront irrational cognitions and make rational statements to herself regarding conditions in her life. She showed an improvement in self esteem.   CONDITION ON DISCHARGE: [name] has intact normal energy. Her concentration is normal. Her interests are normal. She has constructive future goals. She had no thoughts of harming herself or others. She has no hallucinations or delusions. She is sleeping well. She is requiring 50 mg of Vistaril at bedtime as [name] sleep aid at this time with  the goal of discontinuing it as the venlafaxine, buspirone and psychotherapy take further effect.   MENTAL STATUS EXAM UPON DISCHARGE: [name] is alert. Her eye contact is good. Her concentration is normal. She is oriented completely to all spheres. Her memory is intact to immediate, recent, and remote. Her fund of knowledge, intelligence, and use of language are normal. Her speech involves normal rate and prosody without dysarthria. Thought process is logical, coherent, and goal directed. No looseness of associations. Thought content: No thoughts of harming herself, no thoughts of harming others. No delusions or hallucinations. Insight is intact. Judgment is intact. Affect is broad and appropriate. Mood is within normal limits.   DISCHARGE DIAGNOSES:  AXIS I:  1. Major depressive disorder, recurrent, in clinical remission.  2. Generalized anxiety disorder, well controlled.   AXIS II: Deferred.   AXIS III:  1. Obstructive sleep apnea. 2. Diabetes mellitus.  3. Right lower extremity neuropathy.   AXIS IV: Primary support group, economic.   AXIS V: 55.   [name] is not at risk for harming herself or others. She agrees to call emergency services immediately for any thoughts of harming herself, thoughts of harming others, or distress. She agrees to not drive if drowsy.   DIET: Regular.   ACTIVITY: Routine.   DISCHARGE MEDICATIONS:  1. Albuterol ipratropium oral inhaler 2 puffs q.6h. p.r.n.  2. Advair Diskus 25/50 inhaler inhalation b.i.d.  3. Tiotropium hand haler 1 capsule inhalation daily. 4. Lasix 20 mg daily.  5. Potassium chloride 8 mEq daily. 6. Gabapentin 300 mg t.i.d.  7. Metformin 500 mg b.i.d.  8. HCTZ 12.5 mg daily.  9. Minocycline 100 mg oral q.12 hours.  10. Requip 0.5 mg t.i.d.  11. Hydroxyzine pamoate 25 to 50 mg at bedtime p.r.n. insomnia. 12. Buspirone 5 mg t.i.d.  13. Venlafaxine XR 150 mg daily. 14. Klonopin 0.5 mg b.i.d. p.r.n. anxiety with the goal of  discontinuing the Klonopin as her venlafaxine, buspirone and psychotherapy gain further affect.   DIET: ADA 2000 calorie.   ACTIVITY: Routine.   FOLLOW UP: [facility]  04/11 12 noon. She will also see Fatima SangerHeather Carroll for follow up of her medical problems including her right lower extremity neuropathy symptoms 04/08 11:00 [name].m.    ____________________________ Adelene AmasJames S. Rexanne Inocencio, MD jsw:cms D: 09/18/2011 23:05:57 ET T: 09/21/2011 12:28:16 ET JOB#: [id number]  cc: Adelene AmasJames S. Sianna Garofano, MD, <Dictator> Lester CarolinaJAMES S Elisabetta Mishra MD ELECTRONICALLY SIGNED 09/24/2011 0:05
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
## 2016-06-17 ENCOUNTER — Other Ambulatory Visit: Payer: Self-pay | Admitting: Obstetrics and Gynecology

## 2016-06-17 DIAGNOSIS — Z1231 Encounter for screening mammogram for malignant neoplasm of breast: Secondary | ICD-10-CM

## 2016-10-17 ENCOUNTER — Other Ambulatory Visit: Payer: Self-pay | Admitting: Obstetrics and Gynecology

## 2016-11-21 ENCOUNTER — Other Ambulatory Visit: Payer: Self-pay | Admitting: Obstetrics and Gynecology

## 2016-12-21 ENCOUNTER — Other Ambulatory Visit: Payer: Self-pay | Admitting: Obstetrics and Gynecology

## 2017-01-21 ENCOUNTER — Other Ambulatory Visit: Payer: Self-pay | Admitting: Obstetrics and Gynecology

## 2017-02-01 ENCOUNTER — Other Ambulatory Visit: Payer: Self-pay | Admitting: Obstetrics and Gynecology

## 2020-07-07 ENCOUNTER — Observation Stay
Admission: EM | Admit: 2020-07-07 | Discharge: 2020-07-08 | Disposition: A | Discharge status: Home / Self Care | Payer: Medicare Other | Attending: Internal Medicine | Admitting: Internal Medicine

## 2020-07-07 ENCOUNTER — Other Ambulatory Visit: Payer: Self-pay

## 2020-07-07 ENCOUNTER — Emergency Department: Payer: Medicare Other

## 2020-07-07 DIAGNOSIS — I10 Essential (primary) hypertension: Secondary | ICD-10-CM | POA: Diagnosis not present

## 2020-07-07 DIAGNOSIS — I252 Old myocardial infarction: Secondary | ICD-10-CM | POA: Diagnosis not present

## 2020-07-07 DIAGNOSIS — E66813 Obesity, class 3: Secondary | ICD-10-CM | POA: Diagnosis present

## 2020-07-07 DIAGNOSIS — N179 Acute kidney failure, unspecified: Secondary | ICD-10-CM | POA: Diagnosis present

## 2020-07-07 DIAGNOSIS — R0602 Shortness of breath: Secondary | ICD-10-CM | POA: Insufficient documentation

## 2020-07-07 DIAGNOSIS — I4891 Unspecified atrial fibrillation: Principal | ICD-10-CM | POA: Insufficient documentation

## 2020-07-07 DIAGNOSIS — Z7984 Long term (current) use of oral hypoglycemic drugs: Secondary | ICD-10-CM | POA: Insufficient documentation

## 2020-07-07 DIAGNOSIS — Z20822 Contact with and (suspected) exposure to covid-19: Secondary | ICD-10-CM | POA: Diagnosis not present

## 2020-07-07 DIAGNOSIS — Z7901 Long term (current) use of anticoagulants: Secondary | ICD-10-CM | POA: Diagnosis not present

## 2020-07-07 DIAGNOSIS — Z79899 Other long term (current) drug therapy: Secondary | ICD-10-CM | POA: Insufficient documentation

## 2020-07-07 DIAGNOSIS — J449 Chronic obstructive pulmonary disease, unspecified: Secondary | ICD-10-CM | POA: Diagnosis present

## 2020-07-07 DIAGNOSIS — E119 Type 2 diabetes mellitus without complications: Secondary | ICD-10-CM

## 2020-07-07 DIAGNOSIS — R079 Chest pain, unspecified: Secondary | ICD-10-CM | POA: Diagnosis present

## 2020-07-07 DIAGNOSIS — E876 Hypokalemia: Secondary | ICD-10-CM | POA: Diagnosis present

## 2020-07-07 HISTORY — DX: Unspecified diastolic (congestive) heart failure: I50.30

## 2020-07-07 HISTORY — DX: Atherosclerotic heart disease of native coronary artery without angina pectoris: I25.10

## 2020-07-07 HISTORY — DX: Obesity, unspecified: E66.9

## 2020-07-07 HISTORY — DX: Hyperlipidemia, unspecified: E78.5

## 2020-07-07 HISTORY — DX: Personal history of nicotine dependence: Z87.891

## 2020-07-07 HISTORY — DX: Endometrial intraepithelial neoplasia (EIN): N85.02

## 2020-07-07 HISTORY — DX: Type 2 diabetes mellitus without complications: E11.9

## 2020-07-07 HISTORY — DX: Acute myocardial infarction, unspecified: I21.9

## 2020-07-07 HISTORY — DX: Essential (primary) hypertension: I10

## 2020-07-07 HISTORY — DX: Chronic obstructive pulmonary disease, unspecified: J44.9

## 2020-07-07 LAB — CBC
HCT: 43 % (ref 36.0–46.0)
Hemoglobin: 13.4 g/dL (ref 12.0–15.0)
MCH: 27.9 pg (ref 26.0–34.0)
MCHC: 31.2 g/dL (ref 30.0–36.0)
MCV: 89.6 fL (ref 80.0–100.0)
Platelets: 314 10*3/uL (ref 150–400)
RBC: 4.8 MIL/uL (ref 3.87–5.11)
RDW: 15.7 % — ABNORMAL HIGH (ref 11.5–15.5)
WBC: 11 10*3/uL — ABNORMAL HIGH (ref 4.0–10.5)
nRBC: 0 % (ref 0.0–0.2)

## 2020-07-07 LAB — BASIC METABOLIC PANEL
Anion gap: 11 (ref 5–15)
BUN: 14 mg/dL (ref 6–20)
CO2: 25 mmol/L (ref 22–32)
Calcium: 10 mg/dL (ref 8.9–10.3)
Chloride: 104 mmol/L (ref 98–111)
Creatinine, Ser: 1.12 mg/dL — ABNORMAL HIGH (ref 0.44–1.00)
GFR, Estimated: 56 mL/min — ABNORMAL LOW (ref >60)
Glucose, Bld: 109 mg/dL — ABNORMAL HIGH (ref 70–99)
Potassium: 3.1 mmol/L — ABNORMAL LOW (ref 3.5–5.1)
Sodium: 140 mmol/L (ref 135–145)

## 2020-07-07 LAB — TROPONIN I (HIGH SENSITIVITY)
Troponin I (High Sensitivity): 12 ng/L (ref <18)
Troponin I (High Sensitivity): 11 ng/L (ref <18)

## 2020-07-07 LAB — BRAIN NATRIURETIC PEPTIDE: B Natriuretic Peptide: 135.6 pg/mL — ABNORMAL HIGH (ref 0.0–100.0)

## 2020-07-07 LAB — CBG MONITORING, ED: Glucose-Capillary: 130 mg/dL — ABNORMAL HIGH (ref 70–99)

## 2020-07-07 LAB — MAGNESIUM: Magnesium: 1.8 mg/dL (ref 1.7–2.4)

## 2020-07-07 MED ORDER — INSULIN ASPART 100 UNIT/ML ~~LOC~~ SOLN
0.0000 [IU] | Freq: Three times a day (TID) | SUBCUTANEOUS | Status: DC
  Administered 2020-07-08: 3 [IU] via SUBCUTANEOUS
  Filled 2020-07-07: qty 1

## 2020-07-07 MED ORDER — SODIUM CHLORIDE 0.9 % IV BOLUS
500.0000 mL | Freq: Once | INTRAVENOUS | Status: AC
  Administered 2020-07-07: 500 mL via INTRAVENOUS

## 2020-07-07 MED ORDER — DILTIAZEM HCL 25 MG/5ML IV SOLN
25.0000 mg | Freq: Once | INTRAVENOUS | Status: AC
  Administered 2020-07-07: 25 mg via INTRAVENOUS
  Filled 2020-07-07: qty 5

## 2020-07-07 MED ORDER — SODIUM CHLORIDE 0.9 % IV BOLUS: 1000.0000 mL | Freq: Once | INTRAVENOUS | Status: DC

## 2020-07-07 MED ORDER — POTASSIUM CHLORIDE CRYS ER 20 MEQ PO TBCR
40.0000 meq | EXTENDED_RELEASE_TABLET | Freq: Once | ORAL | Status: AC
  Administered 2020-07-07: 40 meq via ORAL
  Filled 2020-07-07: qty 2

## 2020-07-07 MED ORDER — ACETAMINOPHEN 325 MG PO TABS: 650.0000 mg | ORAL_TABLET | ORAL | Status: DC | PRN

## 2020-07-07 MED ORDER — ONDANSETRON HCL 4 MG/2ML IJ SOLN: 4.0000 mg | Freq: Four times a day (QID) | INTRAMUSCULAR | Status: DC | PRN

## 2020-07-07 MED ORDER — FUROSEMIDE 10 MG/ML IJ SOLN
40.0000 mg | Freq: Once | INTRAMUSCULAR | Status: AC
  Administered 2020-07-08: 40 mg via INTRAVENOUS
  Filled 2020-07-07: qty 4

## 2020-07-07 MED ORDER — INSULIN ASPART 100 UNIT/ML ~~LOC~~ SOLN: 0.0000 [IU] | Freq: Every day | SUBCUTANEOUS | Status: DC

## 2020-07-07 MED ORDER — POTASSIUM CHLORIDE 10 MEQ/100ML IV SOLN
10.0000 meq | INTRAVENOUS | Status: AC
Start: 2020-07-07 — End: 2020-07-07
  Administered 2020-07-07: 10 meq via INTRAVENOUS
  Filled 2020-07-07: qty 100

## 2020-07-07 MED ORDER — SODIUM CHLORIDE 0.9 % IV SOLN: INTRAVENOUS | Status: AC

## 2020-07-07 MED ORDER — ENOXAPARIN SODIUM 40 MG/0.4ML ~~LOC~~ SOLN
40.0000 mg | SUBCUTANEOUS | Status: DC
  Administered 2020-07-07: 40 mg via SUBCUTANEOUS
  Filled 2020-07-07: qty 0.4

## 2020-07-07 MED ORDER — DILTIAZEM HCL 60 MG PO TABS
30.0000 mg | ORAL_TABLET | Freq: Four times a day (QID) | ORAL | Status: DC
  Administered 2020-07-07: 30 mg via ORAL
  Filled 2020-07-07: qty 1

## 2020-07-07 NOTE — ED Provider Notes (Signed)
[facility] Emergency Department Provider Note  ____________________________________________  Time seen: Approximately 5:48 PM  I have reviewed the triage vital signs and the nursing notes.   HISTORY  Chief Complaint Chest Pain and Shortness of Breath    HPI [name] is a 60 y.o. female who presents the emergency department complaining of chest tightness, palpitations sensation, shortness of breath with activity.  Patient states that if she is sitting still she does not feel "bad" but states she stands up the chest tightness occurs, she feels like her heart is racing/palpitating and she becomes short of breath.  Patient has a history of MI in 2010.  She states that these symptoms feel nothing like the previous MI.  Patient has no other cardiac history.  She is never been diagnosed with an arrhythmia.  She denies any fevers or chills, URI symptoms, abdominal symptoms.  Patient does not take any medications for this complaint prior to arrival.         History reviewed. No pertinent past medical history.  Patient Active Problem List   Diagnosis Date Noted  . HTN (hypertension) 07/07/2020  . Type 2 diabetes mellitus without complication (HCC) 07/07/2020  . Atrial fibrillation with rapid ventricular response - New onset(HCC) 07/07/2020  . New onset atrial fibrillation (HCC) 07/07/2020  . History of MI (myocardial infarction) 07/07/2020  . Obesity, Class III, BMI 40-49.9 (morbid obesity) (HCC) 07/07/2020  . Chest pain 07/07/2020  . Chronic obstructive pulmonary disease, unspecified (HCC) 09/09/2008    History reviewed. No pertinent surgical history.  Prior to Admission medications   Not on File    Allergies Patient has no known allergies.  History reviewed. No pertinent family history.  Social History     Review of Systems  Constitutional: No fever/chills Eyes: No visual changes. No discharge ENT: No upper respiratory  complaints. Cardiovascular: no chest pain.  Patient describes palpitations, chest pressure Respiratory: no cough. No SOB. Gastrointestinal: No abdominal pain.  No nausea, no vomiting.  No diarrhea.  No constipation. Genitourinary: Negative for dysuria. No hematuria Musculoskeletal: Negative for musculoskeletal pain. Skin: Negative for rash, abrasions, lacerations, ecchymosis. Neurological: Negative for headaches, focal weakness or numbness.  10 System ROS otherwise negative.  ____________________________________________   PHYSICAL EXAM:  VITAL SIGNS: ED Triage Vitals  Enc Vitals Group     BP 07/07/20 1624 (!) 163/82     Pulse Rate 07/07/20 1624 (!) 129     Resp 07/07/20 1624 (!) 26     Temp 07/07/20 1624 98.8 F (37.1 C)     Temp Source 07/07/20 1624 Oral     SpO2 07/07/20 1624 99 %     Weight 07/07/20 1625 (!) 375 lb (170.1 kg)     Height 07/07/20 1625 5\' 3"  (1.6 m)     Head Circumference --      Peak Flow --      Pain Score 07/07/20 1625 0     Pain Loc --      Pain Edu? --      Excl. in GC? --      Constitutional: Alert and oriented. Well appearing and in no acute distress. Eyes: Conjunctivae are normal. PERRL. EOMI. Head: Atraumatic. ENT:      Ears:       Nose: No congestion/rhinnorhea.      Mouth/Throat: Mucous membranes are moist.  Neck: No stridor.    Cardiovascular: Irregular regular rhythm with tachycardia between 110 and 135 palpation. Normal S1 and S2.  Good  peripheral circulation. Respiratory: Normal respiratory effort without tachypnea or retractions. Lungs CTAB. Good air entry to the bases with no decreased or absent breath sounds. Gastrointestinal: Bowel sounds 4 quadrants. Soft and nontender to palpation. No guarding or rigidity. No palpable masses. No distention. No CVA tenderness. Musculoskeletal: Full range of motion to all extremities. No gross deformities appreciated. Neurologic:  Normal speech and language. No gross focal neurologic deficits are  appreciated.  Skin:  Skin is warm, dry and intact. No rash noted. Psychiatric: Mood and affect are normal. Speech and behavior are normal. Patient exhibits appropriate insight and judgement.   ____________________________________________   LABS (all labs ordered are listed, but only abnormal results are displayed)  Labs Reviewed  BASIC METABOLIC PANEL - Abnormal; Notable for the following components:      Result Value   Potassium 3.1 (*)    Glucose, Bld 109 (*)    Creatinine, Ser 1.12 (*)    GFR, Estimated 56 (*)    All other components within normal limits  CBC - Abnormal; Notable for the following components:   WBC 11.0 (*)    RDW 15.7 (*)    All other components within normal limits  SARS CORONAVIRUS 2 (TAT 6-24 HRS)  TROPONIN I (HIGH SENSITIVITY)  TROPONIN I (HIGH SENSITIVITY)   ____________________________________________  EKG  ED ECG REPORT I, Delorise Royals Annaleah Arata,  personally viewed and interpreted this ECG.   Date: 07/07/2020  EKG Time: 1619 hrs.  Rate: 136 bpm  Rhythm: atrial fibrillation, rate 130s, Compared to previous EKG from May 2014, new onset A. fib  Axis: Normal axis  Intervals:none  ST&T Change: No gross ST elevation or depression noted. nonspecific T wave changes  No STEMI.  A. fib with RVR with rates in the 130s to 150s ____________________________________________  RADIOLOGY I personally viewed and evaluated these images as part of my medical decision making, as well as reviewing the written report by the radiologist.  ED Provider Interpretation:   DG Chest 2 View  Result Date: 07/07/2020 CLINICAL DATA:  60 year old female with shortness of breath. EXAM: CHEST - 2 VIEW COMPARISON:  Chest radiograph dated 10/21/2012. FINDINGS: Mild cardiomegaly with vascular congestion and edema. Trace pleural effusions may be present. No pneumothorax. No acute osseous pathology. IMPRESSION: Cardiomegaly with findings of CHF. Pneumonia is not excluded clinical  correlation is recommended. Electronically Signed   By: Elgie Collard M.D.   On: 07/07/2020 17:51    ____________________________________________    PROCEDURES  Procedure(s) performed:    Procedures    Medications  diltiazem (CARDIZEM) injection 25 mg (25 mg Intravenous Given 07/07/20 1843)     ____________________________________________   INITIAL IMPRESSION / ASSESSMENT AND PLAN / ED COURSE  Pertinent labs & imaging results that were available during my care of the patient were reviewed by me and considered in my medical decision making (see chart for details).  Review of the [place] CSRS was performed in accordance of the [facility] prior to dispensing any controlled drugs.           Patient's diagnosis is consistent with A. fib with RVR.  Patient presented to the emergency department with a palpitation/heart racing sensation with movement.  Patient states that symptoms began 3 days ago.  She states that there is some pressure in her chest when she stands up but there is no frank pain.  She had a MI in 2010 and states that this feels nothing like her previous MI.  Patient states that it is a pounding/palpitations/heart  racing sensation.  She states that this occurs with any activity and sometimes at rest.  No history of A. fib or other cardiac arrhythmia.  Patient did have findings consistent with A. fib with RVR and has been between the 110s and 140s here in the emergency department.  Patient had received a beta-blocker from EMS which did not have good rate control.  Patient was started on Cardizem in the emergency department and continues to be tachycardic anywhere from 110-130.  At this time will consult hospitalist service for admission..     ____________________________________________  FINAL CLINICAL IMPRESSION(S) / ED DIAGNOSES  Final diagnoses:  Atrial fibrillation with RVR (HCC)      NEW MEDICATIONS STARTED DURING THIS VISIT:  ED Discharge Orders    None           This chart was dictated using voice recognition software/Dragon. Despite best efforts to proofread, errors can occur which can change the meaning. Any change was purely unintentional.   Racheal Patches, PA-C 07/07/20 1956    Dionne Bucy, MD 07/07/20 2243

## 2020-07-07 NOTE — ED Notes (Addendum)
First Nurse Note: Pt to ED via [facility] from home for chest pain and shortness of breath and dizziness x 1 week. Pt in new onset uncontrolled A Fib rate 150-200 per EMS. Pt was given Metoprolol 5 mg IV, pt also got 500 mg   121/80 RR 18 41 Co2 89% pt placed on 2 liters 99% on 2 liters  20 G LAC CBG 141

## 2020-07-07 NOTE — ED Triage Notes (Signed)
Pt comes ems from home with chest pain, rapid heart rate, SOB. Pt in afib RVR on EKG and pt states has never heard of it. States "I feel better now I can go home".

## 2020-07-07 NOTE — H&P (Signed)
History and Physical    STEPHENY [name] [id number]:[id number] DOB: Sep 10, 1960 DOA: 07/07/2020  PCP: Ethelda Chick, MD   Patient coming from: Home  I have personally briefly reviewed patient's old medical records in [facility]  Chief Complaint: Palpitations, chest pain  HPI: [name] is a 60 y.o. female with medical history significant for DM, HTN, depression, history of MI in 2010, morbid obesity who presents to the emergency room with a 1 week history of dizziness and palpitations on standing, and on the day of arrival became associated with shortness of breath and chest pain.  She presented by EMS who found her heart rate of 1 50-200 and she was administered metoprolol 5 mg IV in route.  O2 sat with EMS was 89% and patient was placed on 2 L O2 bringing sats to 99%.  Patient denies previous illness.  Denies cough, fever or chills.  Chest pain was of mild intensity, mid chest, exertional, nonradiating, relieved with rest.  She had no abdominal pain, nausea vomiting or diaphoresis, no diarrhea. ED Course: Upon arrival, heart rate was in the 120s to 130s.  She was afebrile with blood pressure 163/82, tachypneic to 26 with O2 sat 99% on room air.  She got an additional 20 units of diltiazem with improvement in heart rate to around 104.  Blood work significant for potassium of 3.1.  Creatinine of 1.12, up from 0.66 a year ago.  Troponin 12>11.  WBC 11,000 with normal hemoglobin. EKG as reviewed by me : A. fib at 129 with no acute ST-T wave changes Imaging: Cardiomegaly with findings of CHF.  Pneumonia is not excluded  Hospitalist consulted for admission.  BMP and magnesium ordered on admission as well as COVID PCR  Review of Systems: As per HPI otherwise all other systems on review of systems negative.    Past Medical History:  Diagnosis Date  . DM (diabetes mellitus) (HCC)   . Endometrial hyperplasia with atypia   . HTN (hypertension)   . Myocardial infarct (HCC)   . Obesity      Past Surgical History:  Procedure Laterality Date  . CHOLECYSTECTOMY    . LAPAROSCOPIC HYSTERECTOMY    . VENTRAL HERNIA REPAIR       reports that she has been smoking. She has never used smokeless tobacco. She reports that she does not drink alcohol. No history on file for drug use.  No Known Allergies  History reviewed. No pertinent family history.    Prior to Admission medications   Not on File    Physical Exam: Vitals:   07/07/20 1624 07/07/20 1625 07/07/20 1844  BP: (!) 163/82  (!) 157/89  Pulse: (!) 129  (!) 104  Resp: (!) 26  20  Temp: 98.8 F (37.1 C)  98 F (36.7 C)  TempSrc: Oral  Oral  SpO2: 99%  99%  Weight:  (!) 170.1 kg   Height:  5\' 3"  (1.6 m)      Vitals:   07/07/20 1624 07/07/20 1625 07/07/20 1844  BP: (!) 163/82  (!) 157/89  Pulse: (!) 129  (!) 104  Resp: (!) 26  20  Temp: 98.8 F (37.1 C)  98 F (36.7 C)  TempSrc: Oral  Oral  SpO2: 99%  99%  Weight:  (!) 170.1 kg   Height:  5\' 3"  (1.6 m)       Constitutional: Somewhat ill-appearing and oriented x 3 .  Mild conversational dyspnea  HEENT:  Head: Normocephalic and atraumatic.         Eyes: PERLA, EOMI, Conjunctivae are normal. Sclera is non-icteric.       Mouth/Throat: Mucous membranes are moist.       Neck: Supple with no signs of meningismus. Cardiovascular:  Irregularly irregular. No murmurs, gallops, or rubs. 2+ symmetrical distal pulses are present . No JVD. No LE edema Respiratory: Respiratory effort increased.Lungs sounds diminished bilaterally.  Few basilar crackles Gastrointestinal: Soft, non tender, and non distended with positive bowel sounds.  Genitourinary: No CVA tenderness. Musculoskeletal: Nontender with normal range of motion in all extremities. No cyanosis, or erythema of extremities. Neurologic:  Face is symmetric. Moving all extremities. No gross focal neurologic deficits . Skin: Skin is warm, dry.  No rash or ulcers Psychiatric: Mood and affect are normal     Labs on Admission: I have personally reviewed following labs and imaging studies  CBC: Recent Labs  Lab 07/07/20 1627  WBC 11.0*  HGB 13.4  HCT 43.0  MCV 89.6  PLT 314   Basic Metabolic Panel: Recent Labs  Lab 07/07/20 1627  NA 140  K 3.1*  CL 104  CO2 25  GLUCOSE 109*  BUN 14  CREATININE 1.12*  CALCIUM 10.0   GFR: Estimated Creatinine Clearance: 83.9 mL/min (A) (by C-G formula based on SCr of 1.12 mg/dL (H)). Liver Function Tests: No results for input(s): AST, ALT, ALKPHOS, BILITOT, PROT, ALBUMIN in the last 168 hours. No results for input(s): LIPASE, AMYLASE in the last 168 hours. No results for input(s): AMMONIA in the last 168 hours. Coagulation Profile: No results for input(s): INR, PROTIME in the last 168 hours. Cardiac Enzymes: No results for input(s): CKTOTAL, CKMB, CKMBINDEX, TROPONINI in the last 168 hours. BNP (last 3 results) No results for input(s): PROBNP in the last 8760 hours. HbA1C: No results for input(s): HGBA1C in the last 72 hours. CBG: No results for input(s): GLUCAP in the last 168 hours. Lipid Profile: No results for input(s): CHOL, HDL, LDLCALC, TRIG, CHOLHDL, LDLDIRECT in the last 72 hours. Thyroid Function Tests: No results for input(s): TSH, T4TOTAL, FREET4, T3FREE, THYROIDAB in the last 72 hours. Anemia Panel: No results for input(s): VITAMINB12, FOLATE, FERRITIN, TIBC, IRON, RETICCTPCT in the last 72 hours. Urine analysis:    Component Value Date/Time   COLORURINE Amber 07/12/2012 1452   APPEARANCEUR Cloudy 07/12/2012 1452   LABSPEC 1.031 07/12/2012 1452   PHURINE 5.0 07/12/2012 1452   GLUCOSEU Negative 07/12/2012 1452   HGBUR 1+ 07/12/2012 1452   BILIRUBINUR Negative 07/12/2012 1452   KETONESUR Trace 07/12/2012 1452   PROTEINUR >=500 07/12/2012 1452   NITRITE Negative 07/12/2012 1452   LEUKOCYTESUR Trace 07/12/2012 1452    Radiological Exams on Admission: DG Chest 2 View  Result Date: 07/07/2020 CLINICAL DATA:   60 year old female with shortness of breath. EXAM: CHEST - 2 VIEW COMPARISON:  Chest radiograph dated 10/21/2012. FINDINGS: Mild cardiomegaly with vascular congestion and edema. Trace pleural effusions may be present. No pneumothorax. No acute osseous pathology. IMPRESSION: Cardiomegaly with findings of CHF. Pneumonia is not excluded clinical correlation is recommended. Electronically Signed   By: Elgie Collard M.D.   On: 07/07/2020 17:51     Assessment/Plan 60 year old female with history of DM, HTN, depression, history of MI in 2010, morbid obesity who presents to the emergency room with a 1 week history of dizziness and palpitations now associated with shortness of breath and chest pain.  Found to be in rapid A. fib   New onset  atrial fibrillation with rapid ventricular response -Patient with palpitations chest pain shortness of breath and rapid A. fib 1 50-200 and receiving metoprolol 5 mg IV with EMS and diltiazem 20 mg IV on arrival of EMS with improvement in rate to the low 100s - Started on oral diltiazem 30 mg every 6 - Continuous cardiac monitoring, echocardiogram - CHADS2 Vascor of 3 so will benefit from systemic anticoagulation for stroke prevention but will await cardiologist recommendations - Cardiology consult    Exertional dyspnea and chest pain   History of MI (myocardial infarction) - Patient presented with chest pain shortness of breath, exertional related to palpitations and likely related to rapid A. fib.  Chest pain-free on arrival -Troponin negativex2 and EKG with no acute ST-T wave changes - Chest x-ray showed mild CHF.  Follow BMP - Echocardiogram to evaluate for wall motio regional wall motion abnormality and to evaluate for CHF  New onset CHF, unspecified -Suspect CHF, likely triggered by new onset A. fib -Patient with shortness of breath, BNP elevated at 135 with chest x-ray showing CHF -IV Lasix.  Hold beta-blockers for now as pt on oral diltiazem.  Continue  home lisinopril 10 mg -Echocardiogram -Daily weights with intake and output monitoring    AKI (acute kidney injury) (HCC) - Creatinine 1.12 above baseline of 0.66 2021 - Gentle bolus, given chest x-ray showed signs of CHF - Monitor renal function    Hypokalemia - Potassium 3.1, IV and oral repletion and monitor    HTN (hypertension) - Resume lisinopril 10 mg pending med rec    Type 2 diabetes mellitus without complication (HCC) -Sliding scale insulin coverage    Obesity, Class III, BMI 40-49.9 (morbid obesity) (HCC) - Complicating factor to overall prognosis and care    DVT prophylaxis: Lovenox prophylaxis pending anticoagulation for stroke prevention Code Status: full code  Family Communication:  nonne  Disposition Plan: Back to previous home environment Consults called: Cardiology Status:At the time of admission, it appears that the appropriate admission status for this patient is INPATIENT. This is judged to be reasonable and necessary in order to provide the required intensity of service to ensure the patient's safety given the presenting symptoms, physical exam findings, and initial radiographic and laboratory data in the context of their  Comorbid conditions.   Patient requires inpatient status due to high intensity of service, high risk for further deterioration and high frequency of surveillance required.   I certify that at the point of admission it is my clinical judgment that the patient will require inpatient hospital care spanning beyond 2 midnights     Andris Baumann MD [facility]     07/07/2020, 8:10 PM

## 2020-07-08 ENCOUNTER — Encounter: Payer: Self-pay | Admitting: Internal Medicine

## 2020-07-08 ENCOUNTER — Inpatient Hospital Stay (HOSPITAL_BASED_OUTPATIENT_CLINIC_OR_DEPARTMENT_OTHER)
Admit: 2020-07-08 | Discharge: 2020-07-08 | Disposition: A | Discharge status: Home / Self Care | Payer: Medicare Other | Attending: Internal Medicine | Admitting: Internal Medicine

## 2020-07-08 DIAGNOSIS — N179 Acute kidney failure, unspecified: Secondary | ICD-10-CM

## 2020-07-08 DIAGNOSIS — I5031 Acute diastolic (congestive) heart failure: Secondary | ICD-10-CM

## 2020-07-08 DIAGNOSIS — I25110 Atherosclerotic heart disease of native coronary artery with unstable angina pectoris: Secondary | ICD-10-CM | POA: Diagnosis not present

## 2020-07-08 DIAGNOSIS — E876 Hypokalemia: Secondary | ICD-10-CM

## 2020-07-08 DIAGNOSIS — I10 Essential (primary) hypertension: Secondary | ICD-10-CM | POA: Diagnosis not present

## 2020-07-08 DIAGNOSIS — E119 Type 2 diabetes mellitus without complications: Secondary | ICD-10-CM | POA: Insufficient documentation

## 2020-07-08 DIAGNOSIS — I5043 Acute on chronic combined systolic (congestive) and diastolic (congestive) heart failure: Secondary | ICD-10-CM

## 2020-07-08 DIAGNOSIS — I4891 Unspecified atrial fibrillation: Secondary | ICD-10-CM | POA: Diagnosis not present

## 2020-07-08 DIAGNOSIS — J449 Chronic obstructive pulmonary disease, unspecified: Secondary | ICD-10-CM | POA: Diagnosis present

## 2020-07-08 DIAGNOSIS — I5033 Acute on chronic diastolic (congestive) heart failure: Secondary | ICD-10-CM

## 2020-07-08 DIAGNOSIS — E785 Hyperlipidemia, unspecified: Secondary | ICD-10-CM | POA: Insufficient documentation

## 2020-07-08 LAB — ECHOCARDIOGRAM COMPLETE
AV Mean grad: 3 mmHg
AV Peak grad: 7.2 mmHg
Ao pk vel: 1.34 m/s
Area-P 1/2: 4.01 cm2
Calc EF: 57.5 %
Height: 63 in
Single Plane A2C EF: 50.2 %
Single Plane A4C EF: 66.6 %
Weight: 6000 oz

## 2020-07-08 LAB — BASIC METABOLIC PANEL
Anion gap: 11 (ref 5–15)
BUN: 11 mg/dL (ref 6–20)
CO2: 23 mmol/L (ref 22–32)
Calcium: 9.3 mg/dL (ref 8.9–10.3)
Chloride: 106 mmol/L (ref 98–111)
Creatinine, Ser: 0.98 mg/dL (ref 0.44–1.00)
GFR, Estimated: >60 mL/min (ref >60)
Glucose, Bld: 134 mg/dL — ABNORMAL HIGH (ref 70–99)
Potassium: 3.2 mmol/L — ABNORMAL LOW (ref 3.5–5.1)
Sodium: 140 mmol/L (ref 135–145)

## 2020-07-08 LAB — CBC
HCT: 41.6 % (ref 36.0–46.0)
Hemoglobin: 13.2 g/dL (ref 12.0–15.0)
MCH: 28.6 pg (ref 26.0–34.0)
MCHC: 31.7 g/dL (ref 30.0–36.0)
MCV: 90.2 fL (ref 80.0–100.0)
Platelets: 303 10*3/uL (ref 150–400)
RBC: 4.61 MIL/uL (ref 3.87–5.11)
RDW: 15.9 % — ABNORMAL HIGH (ref 11.5–15.5)
WBC: 9.8 10*3/uL (ref 4.0–10.5)
nRBC: 0 % (ref 0.0–0.2)

## 2020-07-08 LAB — LIPID PANEL
Cholesterol: 160 mg/dL (ref 0–200)
HDL: 25 mg/dL — ABNORMAL LOW (ref >40)
LDL Cholesterol: 73 mg/dL (ref 0–99)
Total CHOL/HDL Ratio: 6.4 RATIO
Triglycerides: 310 mg/dL — ABNORMAL HIGH (ref <150)
VLDL: 62 mg/dL — ABNORMAL HIGH (ref 0–40)

## 2020-07-08 LAB — SARS CORONAVIRUS 2 (TAT 6-24 HRS): SARS Coronavirus 2: NEGATIVE

## 2020-07-08 MED ORDER — LISINOPRIL 10 MG PO TABS
10.0000 mg | ORAL_TABLET | Freq: Every day | ORAL | Status: DC
  Administered 2020-07-08: 10 mg via ORAL
  Filled 2020-07-08: qty 1

## 2020-07-08 MED ORDER — APIXABAN 5 MG PO TABS
5.0000 mg | ORAL_TABLET | Freq: Two times a day (BID) | ORAL | Status: DC
  Administered 2020-07-08: 5 mg via ORAL
  Filled 2020-07-08: qty 1

## 2020-07-08 MED ORDER — PERFLUTREN LIPID MICROSPHERE
1.0000 mL | INTRAVENOUS | Status: AC | PRN
  Administered 2020-07-08: 3 mL via INTRAVENOUS
  Filled 2020-07-08: qty 10

## 2020-07-08 MED ORDER — FUROSEMIDE 40 MG PO TABS: 40.0000 mg | ORAL_TABLET | Freq: Every day | ORAL | 11 refills | Status: AC

## 2020-07-08 MED ORDER — FUROSEMIDE 10 MG/ML IJ SOLN
20.0000 mg | Freq: Two times a day (BID) | INTRAMUSCULAR | Status: DC
  Administered 2020-07-08: 20 mg via INTRAVENOUS
  Filled 2020-07-08: qty 4

## 2020-07-08 MED ORDER — POTASSIUM CHLORIDE CRYS ER 20 MEQ PO TBCR
40.0000 meq | EXTENDED_RELEASE_TABLET | Freq: Every day | ORAL | Status: DC
  Administered 2020-07-08: 40 meq via ORAL
  Filled 2020-07-08: qty 2

## 2020-07-08 MED ORDER — ROPINIROLE HCL 1 MG PO TABS
1.0000 mg | ORAL_TABLET | Freq: Three times a day (TID) | ORAL | Status: DC
  Administered 2020-07-08 (×2): 1 mg via ORAL
  Filled 2020-07-08 (×2): qty 1

## 2020-07-08 MED ORDER — DILTIAZEM HCL ER 90 MG PO CP12
90.0000 mg | ORAL_CAPSULE | Freq: Two times a day (BID) | ORAL | Status: DC
  Administered 2020-07-08: 90 mg via ORAL
  Filled 2020-07-08 (×2): qty 1

## 2020-07-08 MED ORDER — APIXABAN 5 MG PO TABS: 5.0000 mg | ORAL_TABLET | Freq: Two times a day (BID) | ORAL | 1 refills | Status: AC

## 2020-07-08 MED ORDER — DILTIAZEM HCL ER 90 MG PO CP12: 90.0000 mg | ORAL_CAPSULE | Freq: Two times a day (BID) | ORAL | 1 refills | Status: AC

## 2020-07-08 NOTE — TOC Transition Note (Signed)
Transition of Care [facility]) - CM/SW Discharge Note   Patient Details  Name: [name] MRN: [id number] Date of Birth: 07/10/1960  Transition of Care [facility]) CM/SW Contact:  Marina Goodell Phone Number: [phone] 07/08/2020, 3:02 PM   Clinical Narrative:     Patient changed to OBS, d/c summary in place.  Code 44 given.  CSW updated RN. No TOC needs at this time.  TOC consult completed.    Barriers to Discharge: No Barriers Identified   Patient Goals and CMS Choice        Discharge Placement                       Discharge Plan and Services                                     Social Determinants of Health (SDOH) Interventions     Readmission Risk Interventions No flowsheet data found.

## 2020-07-08 NOTE — Consult Note (Signed)
Cardiology Consult    Patient ID: SHETARA LAUNER MRN: [id number], DOB/AGE: 20-Sep-1960   Admit date: 07/07/2020 Date of Consult: 07/08/2020  Primary Physician: Ethelda Chick, MD Primary Cardiologist: New Requesting Provider: Chancy Milroy, MD  Patient Profile    [name] is a 60 y.o. female with a history of HTN, DMII, HL, HFpEF, obesity, nonobs CAD s/p NSTEMI (2010) w/ question of coronary vasospasm, tob abuse (quit 2020), and COPD, who is being seen today for the evaluation of afib w/ RVR at the request of Dr. Rito Ehrlich.  Past Medical History   Past Medical History:  Diagnosis Date  . (HFpEF) heart failure with preserved ejection fraction (HCC)    a. 08/2008 Echo [facility]): EF 55-60%, diast dysfxn. Dil LA. Nl RV fxn; b. 10/2012 Echo: EF 55-60%, mild MR/TR.  Marland Kitchen COPD (chronic obstructive pulmonary disease) (HCC)   . DM (diabetes mellitus) (HCC)   . Endometrial hyperplasia with atypia   . History of tobacco abuse    a. 30+ pack/year history - quit 2020.  Marland Kitchen HTN (hypertension)   . Hyperlipidemia LDL goal <70   . 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 08/2008 NSTEMI/Cath [facility]): Mild CAD. Nl LV fxn. Suspected coronary vasospasm.  . Obesity     Past Surgical History:  Procedure Laterality Date  . CHOLECYSTECTOMY    . LAPAROSCOPIC HYSTERECTOMY    . VENTRAL HERNIA REPAIR       Allergies  No Known Allergies  History of Present Illness    60 y/o ? w/ a h/o nonobs CAD s/p NSTEMI (2010) w question of coronary vasospasm, DMII, HTN, HL, HFpEF, obesity, remote tob abuse (quit 2020), and COPD.  Catheterization @ time of NSTEMI in 2010 showed "mild coronary artery disease" and there was concern that perhaps symptoms and enzyme changes 2/2 vasospasm.  Echo showed nl EF and she was medically managed.  In 2014, she was admitted to [facility] w/ resp failure/COPD.  Echo showed nl EF.  She lives in [place] by herself.  She is sedentary and activity is very limited due to bilateral knee  pain/OA of the knees.  She says that over the past 6-8 months, she has been having intermittent tachypalpitations assoc w/ chest tightness and dyspnea, initially occurring 1-2x/wk but now occurring several x/day over the past 2 wks. She has also had chest tightness and dyspnea in the absence of palpitations.  Since Christmas, she has also noted increasing wkns - very little energy - can barely get up and get to bathroom.  In setting of above, she began noticing worsening lower ext edema (always has some but worsened over past 2 wks) and has been taking lasix daily (usually just prn).  With this, she noted increased orthostatic lightheadedness.  Due to worsening wkns, palpitations, chest pain, dyspnea, and edema, her dtr came to visit her yesterday and they decided to call EMS.  Upon arrival, she was found to be in afib w/ RVR w/ rates>150.  O2 sat was 89% on RA and she was placed on O2 2lpm via [place].  She was taken to the [facility] ED where rates remained 120's to 130's.  She was treated w/ Dilt boluses w/ improvement in rates and conversion to sinus rhythm.  She did have recurrent tachycardia however, and tele review shows intermittent SVT.  She is currently maintaining sinus rhythm and feeling better.  Inpatient Medications    . diltiazem  90 mg Oral Q12H  . enoxaparin (LOVENOX) injection  40 mg Subcutaneous Q24H  . furosemide  20 mg Intravenous Q12H  . insulin aspart  0-20 Units Subcutaneous TID WC  . insulin aspart  0-5 Units Subcutaneous QHS  . lisinopril  10 mg Oral Daily  . potassium chloride  40 mEq Oral Daily  . rOPINIRole  1 mg Oral TID    Family History    Family History  Problem Relation Age of Onset  . Cancer Mother   . High blood pressure Father   . Diabetes Mellitus I Daughter    She indicated that her mother is deceased. She indicated that her father is alive. She indicated that all of her four sisters are alive. She indicated that her daughter is alive.   Social History    Social  History   Socioeconomic History  . Marital status: Single    Spouse name: Not on file  . Number of children: Not on file  . Years of education: Not on file  . Highest education level: Not on file  Occupational History  . Not on file  Tobacco Use  . Smoking status: Former Smoker    Packs/day: 1.00    Years: 30.00    Pack years: 30.00    Types: Cigarettes    Quit date: 06/15/2018    Years since quitting: 2.0  . Smokeless tobacco: Never Used  Substance and Sexual Activity  . Alcohol use: Never  . Drug use: Never  . Sexual activity: Not on file  Other Topics Concern  . Not on file  Social History Narrative   Lives in [place] by herself.  Very sedentary - ambulation limited by bilat knee pain/OA.  Uses walker @ home and can only move from bedroom to bathroom to kitchen.  Does not leave her home. Has family members do her grocery shopping.   Social Determinants of Health   Financial Resource Strain: Not on file  Food Insecurity: Not on file  Transportation Needs: Not on file  Physical Activity: Not on file  Stress: Not on file  Social Connections: Not on file  Intimate Partner Violence: Not on file     Review of Systems    General:  No chills, fever, night sweats or weight changes.  Cardiovascular:  No chest pain, dyspnea on exertion, edema, orthopnea, palpitations, paroxysmal nocturnal dyspnea. Dermatological: No rash, lesions/masses Respiratory: No cough, dyspnea Urologic: No hematuria, dysuria Abdominal:   No nausea, vomiting, diarrhea, bright red blood per rectum, melena, or hematemesis Neurologic:  No visual changes, wkns, changes in mental status. All other systems reviewed and are otherwise negative except as noted above.  Physical Exam    Blood pressure (!) 115/53, pulse 84, temperature 98 F (36.7 C), temperature source Oral, resp. rate (!) 22, height 5\' 3"  (1.6 m), weight (!) 170.1 kg, SpO2 96 %.  General: Obese, pleasant, NAD Psych: Normal affect. Neuro:  Alert and oriented X 3. Moves all extremities spontaneously. HEENT: Normal  Neck: Supple, obese, difficult to gauge JVP.  No bruits. Lungs:  Resp regular and unlabored, diminished breath sounds @ bases. Heart: RRR distant, no s3, s4, or murmurs. Abdomen: Obese, soft, non-tender, non-distended, BS + x 4.  Extremities: No clubbing, cyanosis.  Trace, bilat LE edema. ZO/XW96P/PT21, Radials 2+ and equal bilaterally.  Labs    Cardiac Enzymes Recent Labs  Lab 07/07/20 1627 07/07/20 1845  TROPONINIHS 12 11      Lab Results  Component Value Date   WBC 9.8 07/08/2020   HGB 13.2 07/08/2020  HCT 41.6 07/08/2020   MCV 90.2 07/08/2020   PLT 303 07/08/2020    Recent Labs  Lab 07/08/20 0500  NA 140  K 3.2*  CL 106  CO2 23  BUN 11  CREATININE 0.98  CALCIUM 9.3  GLUCOSE 134*   Lab Results  Component Value Date   CHOL 160 07/08/2020   HDL 25 (L) 07/08/2020   LDLCALC 73 07/08/2020   TRIG 310 (H) 07/08/2020     Radiology Studies    DG Chest 2 View  Result Date: 07/07/2020 CLINICAL DATA:  60 year old female with shortness of breath. EXAM: CHEST - 2 VIEW COMPARISON:  Chest radiograph dated 10/21/2012. FINDINGS: Mild cardiomegaly with vascular congestion and edema. Trace pleural effusions may be present. No pneumothorax. No acute osseous pathology. IMPRESSION: Cardiomegaly with findings of CHF. Pneumonia is not excluded clinical correlation is recommended. Electronically Signed   By: Elgie Collard M.D.   On: 07/07/2020 17:51    ECG & Cardiac Imaging    AFbi, 136, baseline artifact, nonspecific ST changes - personally reviewed.  Assessment & Plan    1.  Afib w/ RVR:  Pt presented to the ED 1/23 w/ a 6-8 month h/o intermittent tachypalpitations that were initially occurring a few x/month, but over the past 2 wks, have been occurring daily, lasting hours @ a time, and assoc w/ chest tightness and dyspnea.  She has also had progressive weakness over the past 2 wks, prompting her to call  EMS 1/23. She was found to be in Afib w/ RVR, which subsequently converted w/ IV dilt boluses.  She is currently maintaining sinus rhythm, though was having periodic runs of SVT overnight.  Mg 1.8.  K 3.2. K has been supplemented.  TSH pending.  Will obtain echo.  Provided that EF nl, rec cont PO dilt.  CHA2DS2VASc = 5 (HFpEF, HTN, DMII, nonobs CAD, ?).  Will plan to add eliquis provided that EF nl and there's no need for invasive procedures.  2.  PSVT:  In addition to afib on arrival, pt noted to have runs of PSVT since presenting to ED.  Currently quiescent.  As above, await echo and cont oral dilt for now.  If EF down, will need to transition to  blocker.  3.  Acute on chronic HFpEF:  History of echo in 2010 and again in 2014 w/ nl EF.  As above, has been having dyspnea, wkns, and lower ext edema, prompting her to take lasix daily (was prev using prn only).  In setting of tachyarrhythmias, CXR showed CHF.  Volume  presented w/ 1 wk h/o orthostasis followed by chest pain and dyspnea.  O2 sat 89% on EMS arrival  improved w/ 2l [place].  In the setting of rapid Afib, BNP mildly elevated @ 135.6 and CXR w/ cardiomegaly and CHF.  HsTrop nl (12  11).  She received 40 of IV lasix just after midnight and notes improvement in symptoms w/ good urine output.  Body habitus makes exam challenging.  Echo pending. Follow creat closely w/ ongoing IV diuresis.  3.  Nonobstructive CAD/Unstable angina: H/o NSTEMI in 2010 w/ finding of mild CAD and ? Of vasospasm.  She has been having intermittent c/p w/ and w/o tachypalps.  Despite elevated rates on arrival, HsTrop nl thus far.  Currently c/p free @ rest and in sinus rhythm.  Await echo.  If nl EF, would not pursue ischemic eval as body habitus prohibitive for noninvasive testing.  4.  Essential HTN: Stable on PO  dilt and lisinopril.  Watch closely w/ diuresis as she had significant orthostasis prior to admission.  5.  HL: On atorva 40 @ home.  LDL 73.  6.  COPD: H/o tob  abuse - quit 2020.  No active wheezing.  Does not use inhalers @ home.  7.  Hypokalemia: K 3.2 on arrival.  Already supplemented.  8. DMII:  On victoza as outpt.  SSI per IM.  Signed, Nicolasa Ducking, NP 07/08/2020, 11:56 AM  For questions or updates, please contact   Please consult [email or web address] for contact info under Cardiology/STEMI.

## 2020-07-08 NOTE — Discharge Instructions (Signed)

## 2020-07-08 NOTE — Care Management CC44 (Signed)
Condition Code 44 Documentation Completed  Patient Details  Name: [name] MRN: [id number] Date of Birth: 04-Jun-1961   Condition Code 44 given:  Yes Patient signature on Condition Code 44 notice:  Yes Documentation of 2 MD's agreement:  Yes Code 44 added to claim:  Yes    Joseph Art, LCSWA 07/08/2020, 2:42 PM

## 2020-07-08 NOTE — Discharge Summary (Signed)
Discharge Summary  [name] YBO:[id number] DOB: 1960-07-14  PCP: Ethelda Chick, MD  Admit date: 07/07/2020 Discharge date: 07/08/2020  Time spent: 25 minutes  Recommendations for Outpatient Follow-up:   Discharge Diagnoses:  Active Hospital Problems   Diagnosis Date Noted  . Atrial fibrillation with rapid ventricular response - New onset(HCC) 07/07/2020  . COPD (chronic obstructive pulmonary disease) (HCC)   . HTN (hypertension) 07/07/2020  . Type 2 diabetes mellitus without complication (HCC) 07/07/2020  . History of MI (myocardial infarction) 07/07/2020  . Obesity, Class III, BMI 40-49.9 (morbid obesity) (HCC) 07/07/2020  . Chest pain 07/07/2020  . AKI (acute kidney injury) (HCC) 07/07/2020  . Hypokalemia 07/07/2020    Resolved Hospital Problems  No resolved problems to display.    Discharge Condition: Improved, being discharged home  Diet recommendation: Heart healthy  Vitals:   07/08/20 0630 07/08/20 1110  BP: 118/84 (!) 115/53  Pulse: 93 84  Resp: (!) 25 (!) 22  Temp:    SpO2: 97% 96%    History of present illness:  60 year old female past medical history of morbid obesity with BMI of 66, diabetes, hypertension and CAD presented to the emergency room with a 1 week history of dizziness and palpitations and shortness of breath in the emergency room found to have heart rate of 1 50-200 with oxygen saturations at 89% requiring 2 L.  Patient found to be in rapid atrial fibrillation, new finding.  Admitted to the hospitalist service    Hospital Course:  Principal Problem:   Atrial fibrillation with rapid ventricular response - New onset(HCC): Patient received IV Cardizem and heart rate was able to be controlled to goal below 100.  At that point she self converted back to normal sinus rhythm.  Started on oral Cardizem.  Echocardiogram done noting preserved ejection fraction and stable nonenlarged atria bilaterally.  Seen by cardiology who recommended starting her  on Eliquis with a CHADS2 score 5.  Recommended continuing her on her Cardizem as outpatient and Lasix 40 mg.  Patient will be seen in the atrial fibrillation clinic.  Found to be in some mild volume overload, due to her atrial fibrillation.  Received IV Lasix and she diuresed well.  Echocardiogram notes preserved ejection fraction. Active Problems:   HTN (hypertension): Patient had some degree of volume overload with atrial fibrillation.  Following mild diuresis, improved significantly.   Type 2 diabetes mellitus without complication (HCC): Sliding scale.   History of MI (myocardial infarction)   Obesity, Class III, BMI 40-49.9 (morbid obesity) (HCC): Meets criteria BMI greater than 40   Chest pain   AKI (acute kidney injury) (HCC): Secondary to atrial fibrillation and dehydration.  The following day, normalized.    Hypokalemia: Replaced.    COPD (chronic obstructive pulmonary disease) (HCC): Stable.   Procedures:  Echocardiogram done 1/24: Preserved ejection fraction, normal atrial size bilaterally  Consultations:  Cardiology  Discharge Exam: BP (!) 115/53   Pulse 84   Temp 98 F (36.7 C) (Oral)   Resp (!) 22   Ht 5\' 3"  (1.6 m)   Wt (!) 170.1 kg   SpO2 96%   BMI 66.43 kg/m   General: Alert and oriented x3, no acute distress Cardiovascular: Regular rate and rhythm, S1-S2 Respiratory: Decreased breath sounds throughout secondary to body habitus  Discharge Instructions You were cared for by a hospitalist during your hospital stay. If you have any questions about your discharge medications or the care you received while you were in the hospital after you  are discharged, you can call the unit and asked to speak with the hospitalist on call if the hospitalist that took care of you is not available. Once you are discharged, your primary care physician will handle any further medical issues. Please note that NO REFILLS for any discharge medications will be authorized once you are  discharged, as it is imperative that you return to your primary care physician (or establish a relationship with a primary care physician if you do not have one) for your aftercare needs so that they can reassess your need for medications and monitor your lab values.  Discharge Instructions    Amb referral to AFIB Clinic   Complete by: As directed    Diet - low sodium heart healthy   Complete by: As directed    Increase activity slowly   Complete by: As directed      Allergies as of 07/08/2020   No Known Allergies     Medication List    STOP taking these medications   doxycycline 100 MG tablet Commonly known as: VIBRA-TABS     TAKE these medications   apixaban 5 MG Tabs tablet Commonly known as: ELIQUIS Take 1 tablet (5 mg total) by mouth 2 (two) times daily.   atorvastatin 40 MG tablet Commonly known as: LIPITOR Take 40 mg by mouth daily.   diltiazem 90 MG 12 hr capsule Commonly known as: CARDIZEM SR Take 1 capsule (90 mg total) by mouth every 12 (twelve) hours.   furosemide 40 MG tablet Commonly known as: Lasix Take 1 tablet (40 mg total) by mouth daily. What changed:   medication strength  how much to take  when to take this   gabapentin 300 MG capsule Commonly known as: NEURONTIN Take 600 mg by mouth in the morning and at bedtime.   lisinopril 10 MG tablet Commonly known as: ZESTRIL Take 10 mg by mouth daily.   rOPINIRole 1 MG tablet Commonly known as: REQUIP Take 1 mg by mouth 3 (three) times daily.   selenium sulfide 2.5 % shampoo Commonly known as: SELSUN Apply 1 application topically as directed.   traZODone 100 MG tablet Commonly known as: DESYREL Take 100 mg by mouth daily.   Victoza 18 MG/3ML Sopn Generic drug: liraglutide 18 mg.      No Known Allergies    The results of significant diagnostics from this hospitalization (including imaging, microbiology, ancillary and laboratory) are listed below for reference.    Significant  Diagnostic Studies: DG Chest 2 View  Result Date: 07/07/2020 CLINICAL DATA:  60 year old female with shortness of breath. EXAM: CHEST - 2 VIEW COMPARISON:  Chest radiograph dated 10/21/2012. FINDINGS: Mild cardiomegaly with vascular congestion and edema. Trace pleural effusions may be present. No pneumothorax. No acute osseous pathology. IMPRESSION: Cardiomegaly with findings of CHF. Pneumonia is not excluded clinical correlation is recommended. Electronically Signed   By: Elgie CollardArash  Radparvar M.D.   On: 07/07/2020 17:51   ECHOCARDIOGRAM COMPLETE  Result Date: 07/08/2020    ECHOCARDIOGRAM REPORT   Patient Name:   [name] Date of Exam: 07/08/2020 Medical Rec #:  [id number]     Height:       63.0 in Accession #:    [id number]    Weight:       375.0 lb Date of Birth:  06/05/1961      BSA:          2.526 m Patient Age:    59 years      BP:  118/84 mmHg Patient Gender: F             HR:           86 bpm. Exam Location:  [facility] Procedure: 2D Echo, Color Doppler, Cardiac Doppler and Intracardiac            Opacification Agent Indications:     I50.31 CHF-Acute Diastolic  History:         Patient has no prior history of Echocardiogram examinations.                  Previous Myocardial Infarction, COPD; Risk                  Factors:Hypertension, Diabetes and Dyslipidemia.  Sonographer:     Humphrey Rolls RDCS (AE) Referring Phys:  [id number] Andris Baumann Diagnosing Phys: Lorine Bears MD  Sonographer Comments: Technically difficult study due to poor echo windows and suboptimal parasternal window. Image acquisition challenging due to patient body habitus and Image acquisition challenging due to COPD. IMPRESSIONS  1. Left ventricular ejection fraction, by estimation, is 55 to 60%. The left ventricle has normal function. Left ventricular endocardial border not optimally defined to evaluate regional wall motion. Left ventricular diastolic parameters are indeterminate.  2. Right ventricular systolic function is normal.  The right ventricular size is normal. Tricuspid regurgitation signal is inadequate for assessing PA pressure.  3. The mitral valve is normal in structure. No evidence of mitral valve regurgitation. No evidence of mitral stenosis.  4. The aortic valve is normal in structure. Aortic valve regurgitation is not visualized. No aortic stenosis is present.  5. Challenging image quality. FINDINGS  Left Ventricle: Left ventricular ejection fraction, by estimation, is 55 to 60%. The left ventricle has normal function. Left ventricular endocardial border not optimally defined to evaluate regional wall motion. Definity contrast agent was given IV to delineate the left ventricular endocardial borders. The left ventricular internal cavity size was normal in size. There is borderline left ventricular hypertrophy. Left ventricular diastolic parameters are indeterminate. Right Ventricle: The right ventricular size is normal. No increase in right ventricular wall thickness. Right ventricular systolic function is normal. Tricuspid regurgitation signal is inadequate for assessing PA pressure. Left Atrium: Left atrial size was normal in size. Right Atrium: Right atrial size was normal in size. Pericardium: There is no evidence of pericardial effusion. Mitral Valve: The mitral valve is normal in structure. No evidence of mitral valve regurgitation. No evidence of mitral valve stenosis. MV peak gradient, 3.4 mmHg. The mean mitral valve gradient is 2.0 mmHg. Tricuspid Valve: The tricuspid valve is normal in structure. Tricuspid valve regurgitation is not demonstrated. No evidence of tricuspid stenosis. Aortic Valve: The aortic valve is normal in structure. Aortic valve regurgitation is not visualized. No aortic stenosis is present. Aortic valve mean gradient measures 3.0 mmHg. Aortic valve peak gradient measures 7.2 mmHg. Pulmonic Valve: The pulmonic valve was not well visualized. Pulmonic valve regurgitation is not visualized. No evidence  of pulmonic stenosis. Aorta: The aortic root is normal in size and structure. Venous: The inferior vena cava was not well visualized. IAS/Shunts: No atrial level shunt detected by color flow Doppler.   LV Volumes (MOD) LV vol d, MOD A2C: 75.3 ml LV vol d, MOD A4C: 78.8 ml LV vol s, MOD A2C: 37.5 ml LV vol s, MOD A4C: 26.3 ml LV SV MOD A2C:     37.8 ml LV SV MOD A4C:     78.8 ml LV SV MOD  BP:      44.2 ml AORTIC VALVE AV Vmax:           134.00 cm/s AV Vmean:          81.600 cm/s AV VTI:            0.206 m AV Peak Grad:      7.2 mmHg AV Mean Grad:      3.0 mmHg LVOT Vmax:         84.90 cm/s LVOT Vmean:        57.500 cm/s LVOT VTI:          0.134 m LVOT/AV VTI ratio: 0.65 MITRAL VALVE MV Area (PHT): 4.01 cm    SHUNTS MV Peak grad:  3.4 mmHg    Systemic VTI: 0.13 m MV Mean grad:  2.0 mmHg MV Vmax:       0.92 m/s MV Vmean:      66.8 cm/s MV Decel Time: 189 msec MV E velocity: 84.80 cm/s MV A velocity: 66.40 cm/s MV E/A ratio:  1.28 Lorine BearsMuhammad Arida MD Electronically signed by Lorine BearsMuhammad Arida MD Signature Date/Time: 07/08/2020/12:34:54 PM    Final     Microbiology: Recent Results (from the past 240 hour(s))  SARS CORONAVIRUS 2 (TAT 6-24 HRS) Nasopharyngeal Nasopharyngeal Swab     Status: None   Collection Time: 07/08/20  1:15 AM   Specimen: Nasopharyngeal Swab  Result Value Ref Range Status   SARS Coronavirus 2 NEGATIVE NEGATIVE Final    Comment: (NOTE) SARS-CoV-2 target nucleic acids are NOT DETECTED.  The SARS-CoV-2 RNA is generally detectable in upper and lower respiratory specimens during the acute phase of infection. Negative results do not preclude SARS-CoV-2 infection, do not rule out co-infections with other pathogens, and should not be used as the sole basis for treatment or other patient management decisions. Negative results must be combined with clinical observations, patient history, and epidemiological information. The expected result is Negative.  Fact Sheet for  Patients: HairSlick.nohttps://www.fda.gov/media/138098/download  Fact Sheet for Healthcare Providers: quierodirigir.comhttps://www.fda.gov/media/138095/download  This test is not yet approved or cleared by the [place] FDA and  has been authorized for detection and/or diagnosis of SARS-CoV-2 by FDA under an Emergency Use Authorization (EUA). This EUA will remain  in effect (meaning this test can be used) for the duration of the COVID-19 declaration under Se ction 564(b)(1) of the Act, 21 U.S.C. section 360bbb-3(b)(1), unless the authorization is terminated or revoked sooner.  Performed at [facility], [address] [place], [place] [address]      Labs: Basic Metabolic Panel: Recent Labs  Lab 07/07/20 1627 07/08/20 0500  NA 140 140  K 3.1* 3.2*  CL 104 106  CO2 25 23  GLUCOSE 109* 134*  BUN 14 11  CREATININE 1.12* 0.98  CALCIUM 10.0 9.3  MG 1.8  --    Liver Function Tests: No results for input(s): AST, ALT, ALKPHOS, BILITOT, PROT, ALBUMIN in the last 168 hours. No results for input(s): LIPASE, AMYLASE in the last 168 hours. No results for input(s): AMMONIA in the last 168 hours. CBC: Recent Labs  Lab 07/07/20 1627 07/08/20 0500  WBC 11.0* 9.8  HGB 13.4 13.2  HCT 43.0 41.6  MCV 89.6 90.2  PLT 314 303   Cardiac Enzymes: No results for input(s): CKTOTAL, CKMB, CKMBINDEX, TROPONINI in the last 168 hours. BNP: BNP (last 3 results) Recent Labs    07/07/20 1627  BNP 135.6*    ProBNP (last 3 results) No results for  input(s): PROBNP in the last 8760 hours.  CBG: Recent Labs  Lab 07/07/20 2341  GLUCAP 130*       Signed:  Hollice Espy, MD [facility] 07/08/2020, 2:25 PM

## 2020-07-08 NOTE — Progress Notes (Signed)
ANTICOAGULATION CONSULT NOTE  Pharmacy Consult for Eliquis Indication: atrial fibrillation  No Known Allergies  Labs: Recent Labs    07/07/20 1627 07/07/20 1845 07/08/20 0500  HGB 13.4  --  13.2  HCT 43.0  --  41.6  PLT 314  --  303  CREATININE 1.12*  --  0.98  TROPONINIHS 12 11  --     Estimated Creatinine Clearance: 95.9 mL/min (by C-G formula based on SCr of 0.98 mg/dL).    Assessment: 60 year old femal with afib. CHA2DS2VASc score 5. Pharmacy to start Eliquis.  Plan:  Eliquis 5 mg BID. Patient does not meet criteria for lower dose. CBC q72h while inpatient per protocol.  Pricilla Riffle, PharmD 07/08/2020,1:55 PM

## 2020-07-08 NOTE — Progress Notes (Signed)
*  PRELIMINARY RESULTS* Echocardiogram 2D Echocardiogram has been performed.  [name] 07/08/2020, 12:14 PM

## 2020-07-08 NOTE — Care Management Obs Status (Signed)
MEDICARE OBSERVATION STATUS NOTIFICATION   Patient Details  Name: [name] MRN: [id number] Date of Birth: July 23, 1960   Medicare Observation Status Notification Given:  Yes    Joseph Art, LCSWA 07/08/2020, 2:42 PM

## 2020-07-09 LAB — HEMOGLOBIN A1C
Hgb A1c MFr Bld: 6.7 % — ABNORMAL HIGH (ref 4.8–5.6)
Mean Plasma Glucose: 145.59 mg/dL

## 2020-07-09 LAB — TSH: TSH: 2.856 u[IU]/mL (ref 0.350–4.500)

## 2020-07-11 ENCOUNTER — Telehealth: Payer: Self-pay | Admitting: Cardiovascular Disease

## 2020-07-11 NOTE — Telephone Encounter (Signed)
No answer, no VM

## 2020-07-11 NOTE — Telephone Encounter (Signed)
-----   Message from Creig Hines, NP sent at 07/10/2020  6:44 PM EST ----- Regarding: f/u Hey guys,  This lady was d/c'd from the ED the other day.  The hospitalist told her to f/u w/ Afib clinic in [place], but she should f/u with [place] in [place].  She saw me and Arida - so either one of [place] in 2 wks or so.  Thanks,  Thayer Ohm

## 2020-07-12 NOTE — Telephone Encounter (Signed)
Attempted to schedule no ans no vm  

## 2020-07-24 NOTE — Telephone Encounter (Signed)
Called patient to schedule follow up appointment. Patient states she is following up in [facility]
